# Patient Record
Sex: Male | Born: 1950 | Race: White | Hispanic: No | Smoking: Current every day smoker
Health system: Southern US, Community
[De-identification: ages and names within clinical notes are randomized; demographics above are authoritative.]

## PROBLEM LIST (undated history)

## (undated) DIAGNOSIS — M1990 Unspecified osteoarthritis, unspecified site: Secondary | ICD-10-CM

## (undated) DIAGNOSIS — Z9289 Personal history of other medical treatment: Secondary | ICD-10-CM

## (undated) DIAGNOSIS — I639 Cerebral infarction, unspecified: Secondary | ICD-10-CM

## (undated) DIAGNOSIS — K449 Diaphragmatic hernia without obstruction or gangrene: Secondary | ICD-10-CM

## (undated) DIAGNOSIS — G5600 Carpal tunnel syndrome, unspecified upper limb: Secondary | ICD-10-CM

## (undated) DIAGNOSIS — M359 Systemic involvement of connective tissue, unspecified: Secondary | ICD-10-CM

## (undated) DIAGNOSIS — B1920 Unspecified viral hepatitis C without hepatic coma: Secondary | ICD-10-CM

## (undated) DIAGNOSIS — Z720 Tobacco use: Secondary | ICD-10-CM

## (undated) DIAGNOSIS — R772 Abnormality of alphafetoprotein: Secondary | ICD-10-CM

## (undated) DIAGNOSIS — Z860100 Personal history of colon polyps, unspecified: Secondary | ICD-10-CM

## (undated) DIAGNOSIS — I10 Essential (primary) hypertension: Secondary | ICD-10-CM

## (undated) DIAGNOSIS — D494 Neoplasm of unspecified behavior of bladder: Secondary | ICD-10-CM

## (undated) DIAGNOSIS — K219 Gastro-esophageal reflux disease without esophagitis: Secondary | ICD-10-CM

## (undated) DIAGNOSIS — R319 Hematuria, unspecified: Secondary | ICD-10-CM

## (undated) DIAGNOSIS — M898X9 Other specified disorders of bone, unspecified site: Secondary | ICD-10-CM

## (undated) DIAGNOSIS — C61 Malignant neoplasm of prostate: Secondary | ICD-10-CM

## (undated) DIAGNOSIS — M899 Disorder of bone, unspecified: Secondary | ICD-10-CM

## (undated) DIAGNOSIS — K259 Gastric ulcer, unspecified as acute or chronic, without hemorrhage or perforation: Secondary | ICD-10-CM

## (undated) HISTORY — DX: Unspecified viral hepatitis C without hepatic coma: B19.20

## (undated) HISTORY — PX: OTHER SURGICAL HISTORY: SHX169

## (undated) HISTORY — DX: Tobacco use: Z72.0

## (undated) HISTORY — DX: Gastro-esophageal reflux disease without esophagitis: K21.9

## (undated) HISTORY — DX: Unspecified osteoarthritis, unspecified site: M19.90

## (undated) HISTORY — DX: Carpal tunnel syndrome, unspecified upper limb: G56.00

## (undated) HISTORY — DX: Personal history of colon polyps, unspecified: Z86.0100

## (undated) HISTORY — DX: Essential (primary) hypertension: I10

## (undated) HISTORY — DX: Disorder of bone, unspecified: M89.9

## (undated) HISTORY — DX: Gastric ulcer, unspecified as acute or chronic, without hemorrhage or perforation: K25.9

## (undated) HISTORY — DX: Cerebral infarction, unspecified: I63.9

## (undated) HISTORY — DX: Other specified disorders of bone, unspecified site: M89.8X9

## (undated) HISTORY — DX: Abnormality of alphafetoprotein: R77.2

## (undated) HISTORY — DX: Personal history of colonic polyps: Z86.010

## (undated) HISTORY — PX: HERNIA REPAIR: SHX51

## (undated) HISTORY — DX: Malignant neoplasm of prostate: C61

## (undated) HISTORY — DX: Diaphragmatic hernia without obstruction or gangrene: K44.9

---

## 1973-05-14 HISTORY — PX: FRACTURE SURGERY: SHX138

## 2001-05-14 DIAGNOSIS — I639 Cerebral infarction, unspecified: Secondary | ICD-10-CM

## 2001-05-14 HISTORY — DX: Cerebral infarction, unspecified: I63.9

## 2004-05-14 HISTORY — PX: COLONOSCOPY: SHX174

## 2006-05-14 HISTORY — PX: TRANSURETHRAL RESECTION OF PROSTATE: SHX73

## 2009-03-29 ENCOUNTER — Encounter (INDEPENDENT_AMBULATORY_CARE_PROVIDER_SITE_OTHER): Payer: Self-pay | Admitting: *Deleted

## 2009-05-14 HISTORY — PX: ESOPHAGOGASTRODUODENOSCOPY: SHX1529

## 2009-06-02 ENCOUNTER — Ambulatory Visit: Payer: Self-pay | Admitting: Internal Medicine

## 2009-06-03 DIAGNOSIS — R1013 Epigastric pain: Secondary | ICD-10-CM

## 2009-06-03 DIAGNOSIS — R634 Abnormal weight loss: Secondary | ICD-10-CM | POA: Insufficient documentation

## 2009-06-06 ENCOUNTER — Encounter: Payer: Self-pay | Admitting: Internal Medicine

## 2009-06-08 ENCOUNTER — Ambulatory Visit: Payer: Self-pay | Admitting: Internal Medicine

## 2009-06-08 ENCOUNTER — Encounter (INDEPENDENT_AMBULATORY_CARE_PROVIDER_SITE_OTHER): Payer: Self-pay | Admitting: *Deleted

## 2009-06-08 ENCOUNTER — Ambulatory Visit (HOSPITAL_COMMUNITY): Admission: RE | Admit: 2009-06-08 | Discharge: 2009-06-08 | Payer: Self-pay | Admitting: Internal Medicine

## 2009-06-10 ENCOUNTER — Encounter: Payer: Self-pay | Admitting: Internal Medicine

## 2009-06-13 ENCOUNTER — Ambulatory Visit (HOSPITAL_COMMUNITY): Admission: RE | Admit: 2009-06-13 | Discharge: 2009-06-13 | Payer: Self-pay | Admitting: Internal Medicine

## 2009-06-13 ENCOUNTER — Encounter: Payer: Self-pay | Admitting: Internal Medicine

## 2009-06-15 ENCOUNTER — Encounter: Payer: Self-pay | Admitting: Internal Medicine

## 2009-06-16 ENCOUNTER — Encounter (HOSPITAL_COMMUNITY): Admission: RE | Admit: 2009-06-16 | Discharge: 2009-07-16 | Payer: Self-pay | Admitting: Internal Medicine

## 2009-06-17 ENCOUNTER — Encounter: Payer: Self-pay | Admitting: Internal Medicine

## 2009-06-17 DIAGNOSIS — R9389 Abnormal findings on diagnostic imaging of other specified body structures: Secondary | ICD-10-CM | POA: Insufficient documentation

## 2009-06-17 LAB — CONVERTED CEMR LAB
Albumin ELP: 55.1 % — ABNORMAL LOW (ref 55.8–66.1)
Alpha-1-Globulin: 3.2 % (ref 2.9–4.9)
Gamma Globulin: 20.2 % — ABNORMAL HIGH (ref 11.1–18.8)

## 2009-06-20 ENCOUNTER — Encounter: Payer: Self-pay | Admitting: Internal Medicine

## 2009-06-24 ENCOUNTER — Telehealth (INDEPENDENT_AMBULATORY_CARE_PROVIDER_SITE_OTHER): Payer: Self-pay

## 2009-07-12 ENCOUNTER — Ambulatory Visit: Payer: Self-pay | Admitting: Internal Medicine

## 2009-07-12 DIAGNOSIS — Z860100 Personal history of colon polyps, unspecified: Secondary | ICD-10-CM | POA: Insufficient documentation

## 2009-07-12 DIAGNOSIS — Z8546 Personal history of malignant neoplasm of prostate: Secondary | ICD-10-CM

## 2009-07-14 ENCOUNTER — Encounter: Payer: Self-pay | Admitting: Internal Medicine

## 2009-07-15 ENCOUNTER — Encounter (HOSPITAL_COMMUNITY): Admission: RE | Admit: 2009-07-15 | Discharge: 2009-08-14 | Payer: Self-pay | Admitting: Internal Medicine

## 2009-07-20 ENCOUNTER — Encounter: Payer: Self-pay | Admitting: Internal Medicine

## 2009-07-26 ENCOUNTER — Ambulatory Visit (HOSPITAL_COMMUNITY): Payer: Self-pay | Admitting: Oncology

## 2009-08-01 ENCOUNTER — Encounter: Payer: Self-pay | Admitting: Internal Medicine

## 2009-08-15 ENCOUNTER — Encounter: Payer: Self-pay | Admitting: Internal Medicine

## 2009-08-16 ENCOUNTER — Encounter: Payer: Self-pay | Admitting: Internal Medicine

## 2009-09-08 ENCOUNTER — Ambulatory Visit: Payer: Self-pay | Admitting: Internal Medicine

## 2009-09-09 LAB — CONVERTED CEMR LAB
ALT: 24 units/L (ref 0–53)
AST: 20 units/L (ref 0–37)
Bilirubin, Direct: 0.2 mg/dL (ref 0.0–0.3)
Indirect Bilirubin: 0.5 mg/dL (ref 0.0–0.9)

## 2009-09-11 DIAGNOSIS — R1013 Epigastric pain: Secondary | ICD-10-CM

## 2009-09-11 DIAGNOSIS — K3189 Other diseases of stomach and duodenum: Secondary | ICD-10-CM

## 2009-09-22 ENCOUNTER — Encounter: Payer: Self-pay | Admitting: Internal Medicine

## 2009-09-28 ENCOUNTER — Encounter: Payer: Self-pay | Admitting: Internal Medicine

## 2010-01-20 ENCOUNTER — Encounter (INDEPENDENT_AMBULATORY_CARE_PROVIDER_SITE_OTHER): Payer: Self-pay

## 2010-06-13 NOTE — Miscellaneous (Signed)
Summary: Orders Update  Clinical Lists Changes  Problems: Added new problem of OTH NONSPC ABN FINDNG RAD&OTH EXM BODY STRUCTURE (ICD-793.99) Orders: Added new Test order of T-Serum Protein Electrophoresis [phone]) - Signed

## 2010-06-13 NOTE — Letter (Signed)
Summary: TC & PATH REPORT 06, [facility]  TC & PATH REPORT 06, [facility]   Imported By: Ricard Dillon 06/20/2009 15:19:21  _____________________________________________________________________  External Attachment:    Type:   Image     Comment:   External Document

## 2010-06-13 NOTE — Letter (Signed)
Summary: [facility]   Imported By: Diana Eves 08/15/2009 16:27:58  _____________________________________________________________________  External Attachment:    Type:   Image     Comment:   External Document

## 2010-06-13 NOTE — Letter (Signed)
Summary: HIDA SCAN ORDER  HIDA SCAN ORDER   Imported By: Ave Filter 07/12/2009 14:13:43  _____________________________________________________________________  External Attachment:    Type:   Image     Comment:   External Document

## 2010-06-13 NOTE — Letter (Signed)
Summary: CT ABD/PELVIS/DR TAPPER  CT ABD/PELVIS/DR TAPPER   Imported By: Diana Eves 08/01/2009 15:30:41  _____________________________________________________________________  External Attachment:    Type:   Image     Comment:   External Document

## 2010-06-13 NOTE — Letter (Signed)
Summary: External Other  External Other   Imported By: Peggyann Shoals 09/22/2009 09:41:45  _____________________________________________________________________  External Attachment:    Type:   Image     Comment:   External Document

## 2010-06-13 NOTE — Letter (Signed)
Summary: CT SCAN ORDER  CT SCAN ORDER   Imported By: Ricard Dillon 06/15/2009 12:37:20  _____________________________________________________________________  External Attachment:    Type:   Image     Comment:   External Document

## 2010-06-13 NOTE — Progress Notes (Signed)
Summary: Pt wants to hear from last lab work/still having problems  Phone Note Call from Patient   Caller: Patient Summary of Call: Pt would like to hear from last lab. York Spaniel he is still having problems, abd pain, losing weight, and he doesn't know what is going on. Initial call taken by: Cloria Spring LPN,  June 24, 2009 10:55 AM     Appended Document: Pt wants to hear from last lab work/still having problems extensive w/u thus far; some bone abnormalities but nothing to explain GI sx; SPEP not consisitent w MM - whichis good news - needs f/u here soon w extender (next week to 10 days) to evaluate GI Sx further.  Appended Document: Pt wants to hear from last lab work/still having problems tried to call pt- LMOM  Appended Document: Pt wants to hear from last lab work/still having problems pt aware

## 2010-06-13 NOTE — Letter (Signed)
Summary: EGD ORDER  EGD ORDER   Imported By: Diana Eves 06/06/2009 14:15:12  _____________________________________________________________________  External Attachment:    Type:   Image     Comment:   External Document

## 2010-06-13 NOTE — Assessment & Plan Note (Signed)
Summary: WT LOSS,STOMACH PROBLEM,EATTING MAKES HIM SICK/SS   Visit Type:  Initial Visit Primary Care Provider:  TAPPER  Chief Complaint:  WT LOSS & ABD. PAIN .  History of Present Illness: Mathew Grant 60 year old gentleman referred out of the courtesy of Dr. Wyvonnia Lora an [place], [place].  [name] has had at least a two-year history of intermittent boring epigastric pain. It occurs almost always postprandial hye, particularly worse with greasy heavy foods. He doesbetter with  fruits and vegetables he has less pain.  He's had occasional nausea and vomiting; he describes previously having frequent typical reflux symptoms  described as heartburn but over the years the symptoms have subsided. He does not have any odynophagia or dysphagia. He tells me he's lost about 15 pounds over the past year. He has any melena or rectal bleeding. He describes having a colonoscopy in [facility] .  Polyps were removed in 2006.Marland Kitchen There is a distant history of heavy alcohol abuse and parenteral recreational drug use. Mathew Grant tells me has a history of hepatitis C genotype 1A. He was previously offered antiviral therapy in [place], however, he declined treatment. He reportedly underwent both an abdominal CT and ultrasound in [facility] back in 2009 which did not elucidate a cause for this gentleman is symptoms. His gallbladder remains in situ. He is a long-term smoker. He's never had his upper GI tract evaluated. He ihas been on a variety of acid suppressant medications but recently none of which have really helped his abdominal pain very much. Laboratory evaluation through Dr. Jackolyn Confer office in October 2003 and CBC appear completely normal. Did not include a platelet count. Also has a hepatic profile was completely normal his albumin was 4.4 TSH 2.0  Preventive Screening-Counseling & Management  Alcohol-Tobacco     Smoking Status: current  Medications Prior to Update: 1)  None  Current  Medications (verified): 1)  Percocet .... 7.5/325 1 Tablet Every 8 Hrs 2)  Atenolol .... 50mg  Once Daily 3)  Omeprazole .... 20mg  Two Times A Day  Allergies (verified): No Known Drug Allergies  Past History:  Past Medical History: HX OF CVA Arthritis Hepatitis C Hypertension GERD CARPAL TUNNEL  HX OF POLYPS  prostate ca   Past Surgical History: TCS IN 2006 @ [facility] IN 2008 Amputation of 4 fingers L hand   Family History: Father: ALIVE- HEART PROBLEMS  Mother: ALIVE- ARTHRITIS & DM Siblings:  2 BROTHERS  0 SISTERS  No FH of Colon Cancer: Family History of Diabetes:   Social History: Marital Status: SINGLE  Children: 3 Occupation: DISABILITY  Patient currently smokes.  Alcohol Use - no Smoking Status:  current  Vital Signs:  Patient profile:   60 year old male Height:      68 inches Weight:      156 pounds BMI:     23.81 Temp:     98.5 degrees F oral Pulse rate:   60 / minute BP sitting:   128 / 84  (left arm)  Vitals Entered By: Peggyann Shoals (June 02, 2009 11:10 AM)  Physical Exam  General:  very Mathew Grant alert polite gentleman resting comfortably Eyes:  no scleral icterus. Conjunctiva are pink Abdomen:  nondistended positive bowel sounds soft and nontender the abdomen is soft he does have localized epigastric tenderness just below the xiphoid process. I do not appreciate any mass nor do I appreciate any hepatosplenomegaly extremities and no edema  Hemoccults were pending per Dr. Jackolyn Confer office.  Impression & Recommendations:  Impression: 60 year old gentleman with at least a 2 year history of insidiously worsening epigastric pain which almost always occurs post prandially. He has a distant history of chronic long-standing gastroesophageal reflux disease symptoms but his symptoms have settled down over the past couple years. He has  not appreciated any significant improvement with acid suppression therapy. By report, prior CT and ultrasound  failed to demonstrate any significant pathology. He has a history of long-standing alcohol and tobacco exposure. He's never had his upper GI tract evaluated. He has a history of hepatitis C genotype 1A not treated.  I agree with Dr. Margo Common, we need to rule out luminal pathology in his upper GI tract. Clinically, I doubt he has advanced chronic liver disease at this time. His symptoms sound more like gallbladder origin rather than anything else. We need to evaluate his upper GI tract to screen him for a Barrett's, evaluate him for occultl peptic ulcer disease and malignancy. He will also be worthwhile to screen him for stigmata of chronic liver disease  Recommendations: Diagnostic EGD in the near future. Risks, benefits, limitations, alternatives, have been reviewed. His questions have been answered. He is agreeable. If EGD is unrevealing as far as etiology of his pain would then revisit the gallbladder with a HIDA scan.   Will retrieve results of his colonoscopy CT and ultrasound previously done. Would also like to check on the Hemoccults done through Dr. Jackolyn Confer office previously.  Further recommendations to follow. I want to thank Dr. Wyvonnia Lora for his kind referral.  Appended Document: Orders Update-charge    Clinical Lists Changes  Problems: Added new problem of EPIGASTRIC PAIN (ICD-789.06) Added new problem of WEIGHT LOSS, ABNORMAL (ICD-783.21) Orders: Added new Service order of Consultation Level IV [phone]) - Signed

## 2010-06-13 NOTE — Letter (Signed)
Summary: HEMOCCULT RESULTS/DR TAPPER  HEMOCCULT RESULTS/DR TAPPER   Imported By: Diana Eves 06/13/2009 16:50:32  _____________________________________________________________________  External Attachment:    Type:   Image     Comment:   External Document

## 2010-06-13 NOTE — Letter (Signed)
Summary: [facility]  [facility]   Imported By: Diana Eves 07/14/2009 09:58:49  _____________________________________________________________________  External Attachment:    Type:   Image     Comment:   External Document

## 2010-06-13 NOTE — Miscellaneous (Signed)
Summary: Orders Update  Clinical Lists Changes  Problems: Added new problem of SCREENING FOR UNSPECIFIED CONDITION (ICD-V82.9) Orders: Added new Test order of T-Creatinine Blood ([id number]) - Signed 

## 2010-06-13 NOTE — Letter (Signed)
Summary: BONE SCAN ORDER  BINE SCAN ORDER   Imported By: Ricard Dillon 06/15/2009 12:10:37  _____________________________________________________________________  External Attachment:    Type:   Image     Comment:   External Document

## 2010-06-13 NOTE — Assessment & Plan Note (Signed)
Summary: fu, gu   Visit Type:  Follow-up Visit Primary Care Provider:  Tapper  Chief Complaint:  F/U  Gerd/abd pain.  History of Present Illness:  60 year old gentleman with a history of dyspeptic symptoms and weight loss recently. EGD negative for any significant pathology. Gallbladder ultrasound HIDA also negative. He responded very nicely to a 3 week course of Nexium 40 mg orally daily. He states his symptoms were most totally abolished. However, he ran out of samples and did not get his prescription filled. 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 It was recommended in 2006 he return in 3-5 years for followup. He has a history of hepatitis C. genotype 1A with documented viremia. He has declined treatment previously history of a lytic pelvic lesions for which he has seen Dr. Mariel Sleet. Dr. Mariel Sleet saw him last month. Workup in progress. History of low-grade prostate cancer.  Of note, his weight loss is stabilized he currently weighs 154 pounds as he did his last visit   Current Medications (verified): 1)  Percocet .... 7.5/325 1 Tablet Every 8 Hrs As Needed 2)  Atenolol .... 50mg  Once Daily  Allergies (verified): No Known Drug Allergies  Past History:  Past Medical History: Last updated: 06/02/2009 HX OF CVA Arthritis Hepatitis C Hypertension GERD CARPAL TUNNEL  HX OF POLYPS  prostate ca   Past Surgical History: Last updated: 06/02/2009 TCS IN 2006 @ [facility] IN 2008 Amputation of 4 fingers L hand   Family History: Last updated: 06/02/2009 Father: ALIVE- HEART PROBLEMS  Mother: ALIVE- ARTHRITIS & DM Siblings:  2 BROTHERS  0 SISTERS  No FH of Colon Cancer: Family History of Diabetes:   Social History: Last updated: 06/02/2009 Marital Status: SINGLE  Children: 3 Occupation: DISABILITY  Patient currently smokes.  Alcohol Use - no  Vital Signs:  Patient profile:   60  year old male Height:      68 inches Weight:      154.50 pounds BMI:     23.58 Temp:     98.2 degrees F oral Pulse rate:   64 / minute BP sitting:   122 / 80  (left arm) Cuff size:   regular  Vitals Entered By: Cloria Spring LPN (September 08, 2009 3:06 PM)  Physical Exam  General:  alert conversant no acute distress Eyes:  no scleral icterus Abdomen:  flat positive bowel sounds entirely soft and nontender without mass or organomegaly  Impression & Recommendations: Impression: 60 year old gentleman with nonspecific abdominal discomfort significantly relieved with acid suppression therapy. Negative workup thus far is reassuring. 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 He did have relatively high grade adenomas on his 2006 colonoscopy at [facility].  Lytic lesions on CT workup in progress per Dr. Mariel Sleet.  Recommendations: Will simply keep him on long-term acid suppression therapy in the way of Nexium 40 mg orally daily. I have written him a prescription today and provided him with samples once again. I recommended he take 40 mg each day I have also recommended proceeding with a surveillance colonoscopy this time. He want s to wait until summer as his kids are in school and he is a single parent. Plan to call him back in July for surveillance colonoscopy. We'll also check hepatic function profile and an alpha-fetoprotein today. Further recommendations to follow.  Other Orders: T-Hepatic Function [phone]) T-AFP Tumor Markers [phone])  Appended Document: Orders  Update    Clinical Lists Changes  Problems: Added new problem of DYSPEPSIA (ICD-536.8) Orders: Added new Service order of Est. Patient Level IV ([id number]) - Signed

## 2010-06-13 NOTE — Letter (Signed)
Summary: External Other  External Other   Imported By: Peggyann Shoals 09/28/2009 11:32:17  _____________________________________________________________________  External Attachment:    Type:   Image     Comment:   External Document

## 2010-06-13 NOTE — Letter (Signed)
Summary: Patient Notice, Endo Biopsy Results  [facility]  [address]   [place], [place] [address]   Phone: [phone]  Fax: [phone]       June 10, 2009   Mathew Grant [address] [place], [place]  [address] 1950-11-09    Dear [name],  I am pleased to inform you that the biopsies taken during your recent endoscopic examination did not show any evidence of cancer upon pathologic examination.  They did show only mild inflammation.  Additional information/recommendations:  Continue with the treatment plan as outlined on the day of your exam.  Please call us if you are having persistent problems or have questions about your condition that have not been fully answered at this time.  Sincerely,    R. Roetta Sessions MD  [facility] Ph: [phone]   Fax: [phone]   Appended Document: Patient Notice, Endo Biopsy Results Letter mailed to pt.

## 2010-06-13 NOTE — Assessment & Plan Note (Signed)
Summary: GI SX,GLU   Visit Type:  Follow-up Visit Primary Care Provider:  Tapper  Chief Complaint:  F/U test results.  History of Present Illness: Mathew Grant is here for f/u visit. He has 2 yr h/o boring epigastric pain. Also c/o pain in lower back/hips worse in am. W/U so far includes EGD, CT, Bone scan, SPEP. See below for details. He c/o ongoing abd pain, worse with spicy, greasy foods. Sometimes if he lays on stomach, pain is relieved. C/O nausea. He is afraid to eat because of the pain. Heartburn controlled. C/O gradual wt loss over the last one year of 30 pounds. He denies melena, brbpr, constipation, diarrhea.   Prior Abd U/S 8/09 at [facility], gallbladder normal. CBD 7mm. Liver and spleen appeared normal. CT A/P without CM 7/09 unremarkable.   EGD, 06/08/09 -->  Tiny distal esophageal erosions consistent with mild erosive reflux esophagitis, otherwise normal esophagus.  Patchy tiny erosions and submucosal petechiae of the gastric mucosa without ulcer, infiltrating process or other abnormality seen, status post biopsy.  Slightly eroded, friable duodenal mucosa of uncertain significance, status post biopsy. Bx showed peptic duodenitis, chronic gastritis. No H. Pylori.  CT A/P, 06/13/09 -->  No acute inflammatory process within abdomen.  A few lytic lesions are noted left iliac bone.  Correlation with bone scan is recommended to exclude metastatic disease.  Probable TURP defect anterior aspect of prostate.  Bilateral pars defect at L5 level.  There is about 3 mm anterolisthesis L5 on S1 vertebral body. Mild emphysematous changes lung bases.  Bone Scan 06/16/09 --> No evidence of osseous metastatic disease. The observed lesions on the CT are purely lytic, atypical for prostate cancer. Despite a negative radionuclide bone scan, the lesions still could represent purely lytic bony metastases or more commonly multiple myeloma.  Workup for multiple myeloma recommended.    Current Medications  (verified): 1)  Percocet .... 7.5/325 1 Tablet Every 8 Hrs 2)  Atenolol .... 50mg  Once Daily 3)  Omeprazole .... Take 1 Tablet By Mouth Once A Day  Allergies (verified): No Known Drug Allergies  Review of Systems      See HPI GU:  Denies urinary burning, blood in urine, urinary frequency, and urinary hesitancy.  Vital Signs:  Patient profile:   59 year old male Height:      68 inches Weight:      154 pounds BMI:     23.50 Temp:     98.3 degrees F oral Pulse rate:   60 / minute BP sitting:   122 / 80  (left arm) Cuff size:   regular  Vitals Entered By: Cloria Spring LPN (July 13, 5282 1:17 PM)  Physical Exam  General:  Well developed, well nourished, no acute distress. Head:  Normocephalic and atraumatic. Eyes:  Conjunctivae pink, no scleral icterus.  Mouth:  Oropharyngeal mucosa moist, pink.  No lesions, erythema or exudate.    Abdomen:  Soft. Mild epig tenderness. No rebound or guarding. No HSM or masses. No abd bruit or hernia. Extremities:  No clubbing, cyanosis, edema or deformities noted. Neurologic:  Alert and  oriented x4;  grossly normal neurologically. Skin:  Intact without significant lesions or rashes. Psych:  Alert and cooperative. Normal mood and affect.  Impression & Recommendations:  Problem # 1:  EPIGASTRIC PAIN (ICD-789.06)  Ongoing postprandial epigastric pain, gradual weight loss. EGD failed to show cause of pain. Symptoms still sound biliary in nature. Will proceed with HIDA with fatty meal challenge. If patient ends  up with surgery, he has been informed to request liver bx given h/o HCV.  Orders: Est. Patient Level III (95621)  Problem # 2:  COLONIC POLYPS, HX OF (ICD-V12.72)  Will try to retrieve last TCS report and path. Apparently done at [facility]. Previously checked with [facility].  Orders: Est. Patient Level III (30865)  Problem # 3:  ADENOCARCINOMA, PROSTATE, HX OF (ICD-V10.46)  Prostate cancer found at time of TURP for suspected  BPH in 8/08 by Dr. Jerre Simon. Per path, Gleason's score 5. Patient has several lytic appearing lesions on the left iliac bone. Bone scan negative but radiologist concerned they could be due to purely lytic bony metastases or multiple myeloma.  SPEP showed nonspecific diffuse polyclonal type increase in gamma globulins.  ?needs referral back to Dr. Jerre Simon or oncologist. To discuss with Dr. Jena Gauss.  Orders: Est. Patient Level III ([id number])     Appended Document: GI SX,GLU Discussed with patient and with Dr. Jena Gauss. Recommend appt with Dr. Mariel Sleet for h/o prostate cancer, lytic lesions of left iliac, wt loss, back pain. Please arrange.   Appended Document: GI SX,GLU Referral faxed to Dr Mariel Sleet.  Appended Document: GI SX,GLU Received records from [facility]. TCS 12/06. 3mm ascending colon tubular adenoma. 2mm tubular villous adenoma in rectum 10cm from anus. F/U TCS recommended for 3-5 years. Will discuss at next OV to see if patient wants to have TCS done now.  Appended Document: GI SX,GLU Pt is scheduled for OV with Dr. Jena Gauss on 09/08/2009.

## 2010-06-13 NOTE — Letter (Signed)
Summary: [name] REFERRAL  [name] REFERRAL   Imported By: Ave Filter 07/20/2009 15:45:10  _____________________________________________________________________  External Attachment:    Type:   Image     Comment:   External Document

## 2010-06-13 NOTE — Letter (Signed)
Summary: OFFICE NOTES/[facility]  OFFICE NOTES/[facility]   Imported By: Diana Eves 08/16/2009 14:38:38  _____________________________________________________________________  External Attachment:    Type:   Image     Comment:   External Document

## 2010-06-13 NOTE — Letter (Signed)
Summary: Recall, Screening Colonoscopy Only  [facility]  [address]   [place], [place] [address]   Phone: [phone]  Fax: [phone]    January 20, 2010  Mathew Grant [address] [place], [place]  [address] 1950-05-17   Dear [name],   Our records indicate it is time to schedule your colonoscopy.    Please call our office at [phone] and ask for the nurse.   Thank you,  Hendricks Limes, LPN Cloria Spring, LPN  [facility] Ph: [phone]   Fax: [phone]

## 2010-07-31 LAB — CREATININE, SERUM
GFR calc Af Amer: >60 mL/min (ref >60)
GFR calc non Af Amer: >60 mL/min (ref >60)

## 2010-08-07 LAB — DIFFERENTIAL
Basophils Absolute: 0.1 10*3/uL (ref 0.0–0.1)
Basophils Relative: 1 % (ref 0–1)
Eosinophils Relative: 1 % (ref 0–5)
Lymphocytes Relative: 25 % (ref 12–46)
Monocytes Absolute: 0.7 10*3/uL (ref 0.1–1.0)

## 2010-08-07 LAB — COMPREHENSIVE METABOLIC PANEL
ALT: 42 U/L (ref 0–53)
AST: 35 U/L (ref 0–37)
Alkaline Phosphatase: 46 U/L (ref 39–117)
BUN: 11 mg/dL (ref 6–23)
CO2: 30 mEq/L (ref 19–32)
Chloride: 99 mEq/L (ref 96–112)
Creatinine, Ser: 0.82 mg/dL (ref 0.4–1.5)
GFR calc Af Amer: >60 mL/min (ref >60)
GFR calc non Af Amer: >60 mL/min (ref >60)
Glucose, Bld: 90 mg/dL (ref 70–99)
Potassium: 4.3 mEq/L (ref 3.5–5.1)
Total Bilirubin: 0.9 mg/dL (ref 0.3–1.2)

## 2010-08-07 LAB — CBC
MCHC: 34.8 g/dL (ref 30.0–36.0)
RBC: 4.58 MIL/uL (ref 4.22–5.81)
RDW: 13.5 % (ref 11.5–15.5)

## 2010-08-07 LAB — HCV RNA QUANT: HCV Quantitative Log: 6.7 {Log} — ABNORMAL HIGH (ref <1.63)

## 2010-08-07 LAB — PSA: PSA: 0.4 ng/mL (ref 0.10–4.00)

## 2010-08-07 LAB — CANCER ANTIGEN 19-9: CA 19-9: 16.9 U/mL — ABNORMAL LOW (ref <35.0)

## 2010-10-03 ENCOUNTER — Other Ambulatory Visit: Payer: Self-pay | Admitting: Internal Medicine

## 2010-10-03 NOTE — Telephone Encounter (Signed)
Pt needs OV to setup colonoscopy please.

## 2010-10-11 ENCOUNTER — Encounter: Payer: Self-pay | Admitting: Gastroenterology

## 2010-10-11 ENCOUNTER — Ambulatory Visit (INDEPENDENT_AMBULATORY_CARE_PROVIDER_SITE_OTHER): Payer: Medicaid Other | Admitting: Gastroenterology

## 2010-10-11 VITALS — BP 160/95 | HR 58 | Temp 98.4°F | Ht 68.0 in | Wt 149.8 lb

## 2010-10-11 DIAGNOSIS — Z860100 Personal history of colon polyps, unspecified: Secondary | ICD-10-CM

## 2010-10-11 DIAGNOSIS — R634 Abnormal weight loss: Secondary | ICD-10-CM

## 2010-10-11 DIAGNOSIS — K3189 Other diseases of stomach and duodenum: Secondary | ICD-10-CM

## 2010-10-11 DIAGNOSIS — B1920 Unspecified viral hepatitis C without hepatic coma: Secondary | ICD-10-CM

## 2010-10-11 DIAGNOSIS — R1013 Epigastric pain: Secondary | ICD-10-CM

## 2010-10-11 NOTE — Telephone Encounter (Signed)
Patient had OV today 10/11/10 @ 1100 with LSL

## 2010-10-11 NOTE — Assessment & Plan Note (Signed)
Weight is stable her last one year. Reports a 35 pound weight loss over the past 3 years. He'll continue to monitor for any further weight loss on this if he develops this. We'll also retrieve lab work from Dr. Margo Common for review.

## 2010-10-11 NOTE — Assessment & Plan Note (Signed)
Due for surveillance colonoscopy at this time.  I have discussed the risks, alternatives, benefits with regards to but not limited to the risk of reaction to medication, bleeding, infection, perforation and the patient is agreeable to proceed. Written consent to be obtained.  

## 2010-10-11 NOTE — Assessment & Plan Note (Signed)
Well controlled on Nexium 

## 2010-10-11 NOTE — Progress Notes (Signed)
Primary Care Physician:  Louie Boston, MD  Primary Gastroenterologist:  Roetta Sessions, MD  Chief Complaint  Patient presents with  . Colon Cancer Screening    HPI:  Mathew Grant is a 60 y.o. male here to schedule surveillance colonoscopy. He has a history of high grade colonic adenomas, last colonoscopy in 2006. Overall he is doing well. Recently he ran out of Nexium and he had recurrent abdominal pain, sore throat, and cough. Better now back on Nexium. Denies vomiting, dysphagia. BM regular. No melena. Given Celebrex for a few days recently (for arthritis, trying to get off of Percocet which has been over 10 years) and developed one episode of small-volume hematochezia. Resolved after stopping the medication. No longer seeing anyone for prostate cancer. Patient refused chemo or radiation. History of lytic lesions of the iliac crest, negative bone scan and workup for multiple myeloma negative. It was felt that the lytic lesions were atypical for prostate cancer metastasis cannot be excluded. Dr. Margo Common is following his PSA. He states he has not seen a urologist in a couple of years.  Patient states had blood work with Dr. Margo Common a few months ago. He states Dr. Margo Common is following him for his chronic hepatitis C. He has had concerns about his weight loss. Weight down about 35 pounds over the last few years but states it's been stable for the past several months.  Current Outpatient Prescriptions  Medication Sig Dispense Refill  . atenolol (TENORMIN) 50 MG tablet Take 50 mg by mouth daily.        Marland Kitchen NEXIUM 40 MG capsule TAKE 1 CAPSULE EVERY DAY  31 each  11  . oxyCODONE-acetaminophen (PERCOCET) 7.5-325 MG per tablet Take 1 tablet by mouth every 4 (four) hours as needed.          Allergies as of 10/11/2010  . (No Known Allergies)    Past Medical History  Diagnosis Date  . CVA (cerebral infarction)   . Arthritis   . HCV (hepatitis C virus)   . HTN (hypertension)   . GERD  (gastroesophageal reflux disease)   . Personal history of colonic polyps   . Prostate cancer   . Carpal tunnel syndrome   . Lytic lesion of bone on x-ray     Past Surgical History  Procedure Date  . Colonoscopy 2006    [facility], high grade adenomas  . Transurethral resection of prostate 2008  . Amputation of four fingers left hand   . Esophagogastroduodenoscopy 05/2009    mild erosive reflux esophagitis, peptic duodenitis, chronic gastritis. No H. Pylori.    Family History  Problem Relation Age of Onset  . Heart disease Father   . Diabetes Mother   . Colon cancer Neg Hx     History   Social History  . Marital Status: Divorced    Spouse Name: N/A    Number of Children: 3  . Years of Education: N/A   Occupational History  . disabled    Social History Main Topics  . Smoking status: Current Everyday Smoker -- 1.0 packs/day    Types: Cigarettes  . Smokeless tobacco: Not on file  . Alcohol Use: No     none in over six years  . Drug Use: No  . Sexually Active: Not on file   Other Topics Concern  . Not on file   Social History Narrative  . No narrative on file      ROS:  General: Negative for anorexia, fever, chills, fatigue,  weakness. See HPI. Eyes: Negative for vision changes.  ENT: Negative for hoarseness, difficulty swallowing , nasal congestion. CV: Negative for chest pain, angina, palpitations, dyspnea on exertion, peripheral edema.  Respiratory: Negative for dyspnea at rest, dyspnea on exertion, cough, sputum, wheezing.  GI: See history of present illness. GU:  Negative for dysuria, hematuria, urinary incontinence, urinary frequency, nocturnal urination.  MS: Chronic joint pain, low back pain.  Derm: Negative for rash or itching.  Neuro: Negative for weakness, abnormal sensation, seizure, frequent headaches, memory loss, confusion.  Psych: Negative for anxiety, depression, suicidal ideation, hallucinations.  Endo: See HPI. Heme: Negative for bruising or  bleeding. Allergy: Negative for rash or hives.    Physical Examination:  BP 160/95  Pulse 58  Temp(Src) 98.4 F (36.9 C) (Temporal)  Ht 5\' 8"  (1.727 m)  Wt 149 lb 12.8 oz (67.949 kg)  BMI 22.78 kg/m2   General: Well-nourished, well-developed in no acute distress.  Head: Normocephalic, atraumatic.   Eyes: Conjunctiva pink, no icterus. Mouth: Oropharyngeal mucosa moist and pink , no lesions erythema or exudate. Neck: Supple without thyromegaly, masses, or lymphadenopathy.  Lungs: Clear to auscultation bilaterally.  Heart: Regular rate and rhythm, no murmurs rubs or gallops.  Abdomen: Bowel sounds are normal, nontender, nondistended, no hepatosplenomegaly or masses, no abdominal bruits or    hernia , no rebound or guarding.   Extremities: No lower extremity edema.  Neuro: Alert and oriented x 4 , grossly normal neurologically.  Skin: Warm and dry, no rash or jaundice.   Psych: Alert and cooperative, normal mood and affect.

## 2010-10-11 NOTE — Assessment & Plan Note (Signed)
Retrieve lab work from Dr. Margo Common for further review. Patient never received treatment, he declined. No evidence of cirrhosis on CT a year ago. He should have his LFTs and alpha-fetoprotein tumor marker checked every 6 months. We'll arrange for this to be done if not done by Dr. Margo Common recently.

## 2010-10-11 NOTE — Progress Notes (Signed)
Cc to PCP 

## 2010-10-13 DIAGNOSIS — R772 Abnormality of alphafetoprotein: Secondary | ICD-10-CM

## 2010-10-13 HISTORY — PX: COLONOSCOPY: SHX174

## 2010-10-13 HISTORY — DX: Abnormality of alphafetoprotein: R77.2

## 2010-10-24 ENCOUNTER — Encounter: Payer: Self-pay | Admitting: Internal Medicine

## 2010-10-24 ENCOUNTER — Ambulatory Visit (HOSPITAL_COMMUNITY)
Admission: RE | Admit: 2010-10-24 | Discharge: 2010-10-24 | Disposition: A | Discharge status: Home / Self Care | Payer: Medicaid Other | Source: Ambulatory Visit | Attending: Internal Medicine | Admitting: Internal Medicine

## 2010-10-24 DIAGNOSIS — Z860100 Personal history of colon polyps, unspecified: Secondary | ICD-10-CM | POA: Insufficient documentation

## 2010-10-24 DIAGNOSIS — Z79899 Other long term (current) drug therapy: Secondary | ICD-10-CM | POA: Insufficient documentation

## 2010-10-24 DIAGNOSIS — Z8546 Personal history of malignant neoplasm of prostate: Secondary | ICD-10-CM | POA: Insufficient documentation

## 2010-10-24 DIAGNOSIS — Z1211 Encounter for screening for malignant neoplasm of colon: Secondary | ICD-10-CM

## 2010-10-24 DIAGNOSIS — Z09 Encounter for follow-up examination after completed treatment for conditions other than malignant neoplasm: Secondary | ICD-10-CM | POA: Insufficient documentation

## 2010-10-24 DIAGNOSIS — I10 Essential (primary) hypertension: Secondary | ICD-10-CM | POA: Insufficient documentation

## 2010-10-24 HISTORY — PX: OTHER SURGICAL HISTORY: SHX169

## 2010-11-01 LAB — AFP TUMOR MARKER: AFP-Tumor Marker: 11.9

## 2010-11-01 LAB — COMPREHENSIVE METABOLIC PANEL
ALT: 22 U/L (ref 10–40)
Potassium: 3.9 mmol/L
Total bilirubin, fluid: 0.3

## 2010-11-01 LAB — CBC WITH DIFFERENTIAL/PLATELET
Hemoglobin: 14 g/dL (ref 13.5–17.5)
WBC: 8.9

## 2010-11-14 ENCOUNTER — Telehealth: Payer: Self-pay | Admitting: Gastroenterology

## 2010-11-14 ENCOUNTER — Other Ambulatory Visit: Payer: Self-pay | Admitting: Gastroenterology

## 2010-11-14 ENCOUNTER — Other Ambulatory Visit: Payer: Self-pay | Admitting: Internal Medicine

## 2010-11-14 DIAGNOSIS — B1920 Unspecified viral hepatitis C without hepatic coma: Secondary | ICD-10-CM

## 2010-11-14 DIAGNOSIS — R799 Abnormal finding of blood chemistry, unspecified: Secondary | ICD-10-CM

## 2010-11-14 NOTE — Telephone Encounter (Signed)
Per LSL- ok to order lab. Pt needs to have OV in 2 weeks after lab and CT. Tried to call pt- LMOM

## 2010-11-14 NOTE — Telephone Encounter (Signed)
Received fax from Dr. Margo Common for advice regarding elevated AFP on this patient. Patient has given [place] the impression that Dr. Beckey Rutter has been following him for his chronic hepatitis C. Patient previously declined treatment. The last AFP that we have on the patient was from 08/2009 was normal at 2.2. Current AFP was 11.9. CBC was normal, LFTs normal. Last imaging study in 2011 showed normal liver. No evidence of cirrhosis.  To further evaluate elevated AFP, would recommend dedicated CT of the liver with contrast to rule out hepatoma. This can be ordered by [place] or through Dr. Jackolyn Confer office whichever Dr. Margo Common prefers. Please also find out if patient has had HCV RNA quantitative labs done within the last few years.  Please abstract labs into EPIC.  Please call Dr. Jackolyn Confer office and discuss the above.

## 2010-11-14 NOTE — Telephone Encounter (Signed)
Spoke with Arline Asp- Dr. Hillery Jacks nurse. Informed of LSL recommendations. She stated the last hep c bloodwork that was done on the pt was done in 2006. Do you want to order further labs?  They also want [place] to order the CT of the liver.

## 2010-11-14 NOTE — Telephone Encounter (Signed)
Faxed notes to [employer] to obtain pre certification.

## 2010-11-14 NOTE — Telephone Encounter (Signed)
OK to order HCV RNA quantitative.

## 2010-11-16 ENCOUNTER — Encounter: Payer: Self-pay | Admitting: Internal Medicine

## 2010-11-16 NOTE — Telephone Encounter (Signed)
I received approval notice from [employer] #[id number]. I tried to call pt about appointment,no answer,lmom.

## 2010-11-16 NOTE — Telephone Encounter (Signed)
Mailed letter with lab orders and letter about scheduling ov to pt

## 2010-11-16 NOTE — Telephone Encounter (Signed)
Mailed pt a letter about ct scan

## 2010-11-20 ENCOUNTER — Ambulatory Visit (HOSPITAL_COMMUNITY): Admission: RE | Admit: 2010-11-20 | Payer: Medicaid Other | Source: Ambulatory Visit

## 2010-11-22 ENCOUNTER — Ambulatory Visit (HOSPITAL_COMMUNITY)
Admission: RE | Admit: 2010-11-22 | Discharge: 2010-11-22 | Disposition: A | Discharge status: Home / Self Care | Payer: Medicaid Other | Source: Ambulatory Visit | Attending: Internal Medicine | Admitting: Internal Medicine

## 2010-11-22 ENCOUNTER — Other Ambulatory Visit: Payer: Self-pay | Admitting: Gastroenterology

## 2010-11-22 DIAGNOSIS — Z8546 Personal history of malignant neoplasm of prostate: Secondary | ICD-10-CM | POA: Insufficient documentation

## 2010-11-22 DIAGNOSIS — R109 Unspecified abdominal pain: Secondary | ICD-10-CM | POA: Insufficient documentation

## 2010-11-22 DIAGNOSIS — B1920 Unspecified viral hepatitis C without hepatic coma: Secondary | ICD-10-CM

## 2010-11-22 MED ORDER — IOHEXOL 300 MG/ML  SOLN
100.0000 mL | Freq: Once | INTRAMUSCULAR | Status: AC | PRN
  Administered 2010-11-22: 100 mL via INTRAVENOUS

## 2010-11-23 LAB — HCV RNA QUANT RFLX ULTRA OR GENOTYP
HCV Quantitative Log: 7.07 {Log} — ABNORMAL HIGH (ref <1.63)
HCV Quantitative: 11700000 IU/mL — ABNORMAL HIGH (ref <43)

## 2010-11-27 NOTE — Op Note (Signed)
  NAMEJIMI, SCHAPPERT                ACCOUNT NO.:  [id number]  MEDICAL RECORD NO.:  [id number]  LOCATION:  [facility]                          FACILITY:  [facility]  PHYSICIAN:  R. Roetta Sessions, MD FACP FACGDATE OF BIRTH:  August 21, 1950  DATE OF PROCEDURE:  10/24/2010 DATE OF DISCHARGE:                              OPERATIVE REPORT   PROCEDURE:  Ileocolonoscopy surveillance.  INDICATIONS FOR PROCEDURE:  A 60 year old gentleman and last had a colonoscopy over at [facility] in 2006, reporting with possible high-grade dysplasia, apparently he has not had a followup.  He is really not having any problems now as far as lower GI tract symptoms, although previously he took some Celebrex, had a slight amount of rectal bleeding but stopped this medication, he is doing well now.  He is here for surveillance for which he is overdue.  Risks, benefits, limitations, alternatives, imponderables have been discussed, questions answered. Please see the documentation in the medical record.  PROCEDURE NOTE:  O2 saturation, blood pressure, pulse, respirations monitored throughout the entirety of the procedure.  CONSCIOUS SEDATION:  Versed 5 mg IV, Demerol 100 mg IV in divided doses.  INSTRUMENT:  Pentax video chip system.  FINDINGS:  Digital rectal exam revealed no abnormalities.  Endoscopic findings:  Prep was adequate.  Colon:  Colonic mucosa was surveyed from the rectosigmoid junction through the left transverse right colon to the appendiceal orifice, ileocecal valve/cecum.  These structures were well seen and photographed for the record.  Terminal ileum was intubated to 10 cm.  From this level, scope was slowly and cautiously withdrawn.  All previously mentioned mucosal surfaces were again seen.  The colonic mucosa appeared normal as did the terminal mucosa.  The scope was pulled down to the rectum where a thorough examination of rectal mucosa including retroflexed view of the anal verge demonstrated  no abnormalities.  The patient tolerated the procedure well and was reactive to endoscopy.  Cecal withdrawal time 8 minutes.  IMPRESSION:  Normal rectum, colon, terminal ileum.  RECOMMENDATIONS:  Repeat colonoscopy 5 years.     Jonathon Bellows, MD Caleen Essex     RMR/MEDQ  D:  10/24/2010  T:  10/24/2010  Job:  [id number]  cc:   Wyvonnia Lora, MD Fax: [phone]  Electronically Signed by Lorrin Goodell M.D. on 11/27/2010 08:40:13 AM

## 2010-11-30 NOTE — Progress Notes (Signed)
Pt had OV on 12/04/2010.

## 2010-12-04 ENCOUNTER — Ambulatory Visit (INDEPENDENT_AMBULATORY_CARE_PROVIDER_SITE_OTHER): Payer: Medicaid Other | Admitting: Gastroenterology

## 2010-12-04 ENCOUNTER — Encounter: Payer: Self-pay | Admitting: Gastroenterology

## 2010-12-04 VITALS — BP 134/77 | HR 64 | Temp 97.8°F | Ht 69.0 in | Wt 147.4 lb

## 2010-12-04 DIAGNOSIS — Z86010 Personal history of colonic polyps: Secondary | ICD-10-CM

## 2010-12-04 DIAGNOSIS — B1920 Unspecified viral hepatitis C without hepatic coma: Secondary | ICD-10-CM

## 2010-12-04 DIAGNOSIS — R634 Abnormal weight loss: Secondary | ICD-10-CM

## 2010-12-04 DIAGNOSIS — R1013 Epigastric pain: Secondary | ICD-10-CM

## 2010-12-04 DIAGNOSIS — R799 Abnormal finding of blood chemistry, unspecified: Secondary | ICD-10-CM

## 2010-12-04 DIAGNOSIS — R772 Abnormality of alphafetoprotein: Secondary | ICD-10-CM

## 2010-12-04 NOTE — Assessment & Plan Note (Signed)
Postprandial epigastric pain, progressive. Improves with lying prone after meals. Recent CT did not reveal a cause of his pain, this was done for elevated alpha-fetoprotein tumor marker. Gallbladder remains in situ. 30% of stones will not show up on a CT. Check an abdominal ultrasound. He is a chronic smoker. He also has chronic progressive weight loss. Cannot rule out mesenteric ischemia, at least no indication reported on current CT modality. Question SMA syndrome given symptoms improve when he lies prone after meals. 2 discuss with Dr. Jena Gauss further after abdominal ultrasound results.

## 2010-12-04 NOTE — Assessment & Plan Note (Signed)
Chronic hepatitis C. Patient previously declined treatment for fear of debilitating symptoms and the fact that he is a single parent. The last time he discussed treatment options with hepatitis clinic was 7 or 8 years ago according to his report. At this time he continues to decline treatment. We discussed that given his age and the fact that he is not have any evidence of cirrhosis at this point, treatment may or may not be recommended. If he changes his mind he will let us know. At a minimum he should have his hepatoma surveillance including abdominal ultrasound and AFP done every 6 months. See assessment and plan for elevated AFP.

## 2010-12-04 NOTE — Assessment & Plan Note (Signed)
Refer to assessment and plan for epigastric pain. 

## 2010-12-04 NOTE — Progress Notes (Signed)
Cc to Dr. Margo Common

## 2010-12-04 NOTE — Progress Notes (Signed)
Primary Care Physician: Louie Boston, MD  Primary Gastroenterologist:  Roetta Sessions, MD   Chief Complaint  Patient presents with  . Follow-up    HPI: Mathew Grant is a 60 y.o. male here because of history of elevated alpha-fetoprotein tumor marker. We received a request from Dr. Jackolyn Confer office to manage patient's abnormal AFP and hepatitis C. Patient had given [place] the impression that he was being followed by Dr. Margo Common for his chronic hepatitis C since he had declined on treatment previously. As it turned out it had been over 5 years since any labs done with regards to hepatitis C. We rechecked his viral load which was positive. He also had a CT which showed show any signs of hepatoma or cirrhosis. Since his last office visit he also had a high risk surveillance colonoscopy for history of advanced adenomas, no polyps seen at this colonoscopy and followup recommended in 5 years.  He complains of epigastric pain, RUQ pain all worse with spicey, greasy foods. Nausea no vomiting. Lots of belching. No heartburn on Nexium. No dysphagia. BM regular with high fiber diet. No melena, brbpr. Fatigue bad. Has to lay down on stomach for 15 minutes after meals to prevent abdominal pain.  No h/o heart disease but he has had several strokes years ago. Dad had CABG 4v. Smoking 1ppd. No etoh for 7 years.    Current Outpatient Prescriptions  Medication Sig Dispense Refill  . atenolol (TENORMIN) 50 MG tablet Take 50 mg by mouth daily.        Marland Kitchen atorvastatin (LIPITOR) 20 MG tablet Take 20 mg by mouth daily.        Marland Kitchen NEXIUM 40 MG capsule TAKE 1 CAPSULE EVERY DAY  31 each  11  . oxyCODONE-acetaminophen (PERCOCET) 7.5-325 MG per tablet Take 1 tablet by mouth every 4 (four) hours as needed.          Allergies as of 12/04/2010  . (No Known Allergies)    ROS:  General: Negative for anorexia, fever, chills. Complains of ongoing weight loss. Weight is down 2 more pounds. Weight down more than 30 pounds over  the last several years. Complains of fatigue. ENT: Negative for hoarseness, difficulty swallowing , nasal congestion. CV: Negative for chest pain, angina, palpitations, dyspnea on exertion, peripheral edema.  Respiratory: Negative for dyspnea at rest, dyspnea on exertion, cough, sputum, wheezing.  GI: See history of present illness. GU:  Negative for dysuria, hematuria, urinary incontinence, urinary frequency, nocturnal urination.  Endo: Complains of ongoing weight loss, weight down 2 more pounds for a total of over 30 pounds in the last several years.    Physical Examination:   BP 134/77  Pulse 64  Temp(Src) 97.8 F (36.6 C) (Temporal)  Ht 5\' 9"  (1.753 m)  Wt 147 lb 6.4 oz (66.86 kg)  BMI 21.77 kg/m2  General: Well-nourished, well-developed in no acute distress.  Eyes: No icterus. Mouth: Oropharyngeal mucosa moist and pink , no lesions erythema or exudate. Lungs: Clear to auscultation bilaterally.  Heart: Regular rate and rhythm, no murmurs rubs or gallops.  Abdomen: Bowel sounds are normal, nondistended, no hepatosplenomegaly or masses, no abdominal bruits or hernia. Moderate epigastric tenderness without rebound or guarding.   Extremities: No lower extremity edema. No deformity or clubbing  Neuro: Alert and oriented x 4   Skin: Warm and dry, no jaundice.   Psych: Alert and cooperative, normal mood and affect.  Labs:  Recent Results (from the past 672 hour(s))  HCV RNA QUANT  RFLX ULTRA OR GENOTYP   Collection Time   11/22/10 11:50 AM      Component Value Range   HCV Quantitative [id number] (*) <43 (IU/mL)   HCV Quantitative Log 7.07 (*) <1.63 (log 10)  HEPATITIS C GENOTYPE   Collection Time   11/22/10 11:50 AM      Component Value Range   HCV Genotype 1a       Imaging Studies: Ct Abd Wo & W Cm  11/22/2010  *RADIOLOGY REPORT*  Clinical Data: Upper abdominal pain for 1 year, history of prostate cancer  CT ABDOMEN WITHOUT AND WITH CONTRAST  Technique:  Multidetector CT  imaging of the abdomen was performed following the standard protocol before and during bolus administration of intravenous contrast. Sagittal and coronal MPR images reconstructed from axial data set.  Contrast: Dilute oral contrast. 100 ml Omnipaque 300 IV.  Comparison: None.  Findings: Lung bases clear. Probable tiny splenules adjacent to spleen. Liver, spleen, pancreas, kidneys, and adrenal glands normal appearance. Stomach and visualized bowel loops normal appearance. Aorta normal caliber. No mass, adenopathy, free fluid or inflammatory process. Left spondylolysis L5, right L5 incompletely visualized. Lucent foci in ilia bilaterally, uncertain etiology.  IMPRESSION: No acute intra abdominal abnormalities. Lucent foci in the ilia bilaterally, nonspecific but stable since prior study.  Original Report Authenticated By: Lollie Marrow, M.D.

## 2010-12-04 NOTE — Assessment & Plan Note (Addendum)
New elevated alpha-fetoprotein tumor marker in the setting of chronic hepatitis C. No evidence of hepatoma or cirrhosis on recent CT. Recheck alpha-fetoprotein tumor marker in September 2012. Office visit with Dr. Jena Gauss in 3 months.

## 2010-12-04 NOTE — Assessment & Plan Note (Addendum)
Next colonoscopy due in June 2017.

## 2010-12-05 ENCOUNTER — Ambulatory Visit (HOSPITAL_COMMUNITY)
Admission: RE | Admit: 2010-12-05 | Discharge: 2010-12-05 | Disposition: A | Discharge status: Home / Self Care | Payer: Medicaid Other | Source: Ambulatory Visit | Attending: Gastroenterology | Admitting: Gastroenterology

## 2010-12-05 DIAGNOSIS — R1013 Epigastric pain: Secondary | ICD-10-CM | POA: Insufficient documentation

## 2010-12-05 DIAGNOSIS — B1920 Unspecified viral hepatitis C without hepatic coma: Secondary | ICD-10-CM | POA: Insufficient documentation

## 2010-12-13 ENCOUNTER — Encounter: Payer: Self-pay | Admitting: General Practice

## 2010-12-13 NOTE — Progress Notes (Signed)
Quick Note:  Please ask pt to take Nexium 40mg  po bid (30 mins before a meal). If not better by next week, then consider EGD. ______

## 2010-12-13 NOTE — Progress Notes (Signed)
Pt is scheduled for Hida Scan on 12/15/2010@ 8:00am. Pt needs to be NPO after MN.

## 2010-12-13 NOTE — Progress Notes (Signed)
HIDA cancelled. Done in 2011.

## 2010-12-13 NOTE — Progress Notes (Signed)
I called and cx hida scan per Hendricks Limes.  Pt just had exam in 07/2009

## 2010-12-13 NOTE — Progress Notes (Signed)
Addended by: Jennings Books on: 12/13/2010 01:23 PM   Modules accepted: Orders

## 2010-12-15 ENCOUNTER — Encounter (HOSPITAL_COMMUNITY): Payer: Medicaid Other

## 2010-12-20 ENCOUNTER — Telehealth: Payer: Self-pay

## 2010-12-20 NOTE — Telephone Encounter (Signed)
Pt called (RMR pt) pt has been taking nexium bid and he is still having a lot of abd pain and vomiting. Pt cant eat and can only keep water down. Please see U/S notes. Please advise.

## 2010-12-20 NOTE — Telephone Encounter (Signed)
Pt aware, rx called to [facility]. Called in # 30 per AS.  Please schedule egd. Pt is concerned whether or not medicaid will pay for it again.

## 2010-12-20 NOTE — Telephone Encounter (Signed)
Please call pt. Let him Dr. Jena Gauss is off until MON. He is on Nexium for reflux but it sounds like he's having a flare of his Sx. Schedule pt for EGD on 8/13. He may use Phenergan 25 mg 1/2 to 1 tab po q4-6H prn for nausea or vomiting. Follow a full liquid diet. He may pick up a HO.

## 2010-12-25 NOTE — Telephone Encounter (Signed)
Pt stated he can't do this test right now.  He has a lot going on right now and he will call the office back when he is ready.

## 2010-12-26 ENCOUNTER — Other Ambulatory Visit: Payer: Self-pay

## 2010-12-26 DIAGNOSIS — R112 Nausea with vomiting, unspecified: Secondary | ICD-10-CM

## 2010-12-26 NOTE — Telephone Encounter (Signed)
Pt is scheduled for egd 01/03/2011@10 :00am.  Pt is aware of appt and instructions placed in the mail.  Pt would like some samples of Nexium to last him until his next refill which is 01/02/2011.

## 2010-12-26 NOTE — Telephone Encounter (Signed)
Message copied by Jennings Books on Tue Dec 26, 2010  4:40 PM ------      Message from: Myra Rude      Created: Tue Dec 26, 2010  3:17 PM       Pt was returning your call.

## 2010-12-27 NOTE — Telephone Encounter (Signed)
Samples at front desk 

## 2010-12-28 MED ORDER — ESOMEPRAZOLE MAGNESIUM 40 MG PO CPDR: 40.0000 mg | DELAYED_RELEASE_CAPSULE | Freq: Two times a day (BID) | ORAL | Status: DC

## 2011-01-02 MED ORDER — SODIUM CHLORIDE 0.45 % IV SOLN
Freq: Once | INTRAVENOUS | Status: AC
  Administered 2011-01-03: 10:00:00 via INTRAVENOUS

## 2011-01-03 ENCOUNTER — Ambulatory Visit (HOSPITAL_COMMUNITY)
Admission: RE | Admit: 2011-01-03 | Discharge: 2011-01-03 | Disposition: A | Discharge status: Home / Self Care | Payer: Medicaid Other | Source: Ambulatory Visit | Attending: Internal Medicine | Admitting: Internal Medicine

## 2011-01-03 ENCOUNTER — Other Ambulatory Visit: Payer: Self-pay | Admitting: Internal Medicine

## 2011-01-03 ENCOUNTER — Encounter (HOSPITAL_COMMUNITY)
Admission: RE | Disposition: A | Discharge status: Home / Self Care | Payer: Self-pay | Source: Ambulatory Visit | Attending: Internal Medicine

## 2011-01-03 ENCOUNTER — Encounter (HOSPITAL_COMMUNITY): Payer: Self-pay | Admitting: *Deleted

## 2011-01-03 DIAGNOSIS — K449 Diaphragmatic hernia without obstruction or gangrene: Secondary | ICD-10-CM

## 2011-01-03 DIAGNOSIS — R1013 Epigastric pain: Secondary | ICD-10-CM | POA: Insufficient documentation

## 2011-01-03 DIAGNOSIS — K259 Gastric ulcer, unspecified as acute or chronic, without hemorrhage or perforation: Secondary | ICD-10-CM

## 2011-01-03 DIAGNOSIS — R109 Unspecified abdominal pain: Secondary | ICD-10-CM

## 2011-01-03 DIAGNOSIS — R110 Nausea: Secondary | ICD-10-CM | POA: Insufficient documentation

## 2011-01-03 DIAGNOSIS — K2940 Chronic atrophic gastritis without bleeding: Secondary | ICD-10-CM | POA: Insufficient documentation

## 2011-01-03 DIAGNOSIS — R1011 Right upper quadrant pain: Secondary | ICD-10-CM | POA: Insufficient documentation

## 2011-01-03 DIAGNOSIS — K2960 Other gastritis without bleeding: Secondary | ICD-10-CM

## 2011-01-03 HISTORY — PX: ESOPHAGOGASTRODUODENOSCOPY: SHX5428

## 2011-01-03 HISTORY — DX: Gastric ulcer, unspecified as acute or chronic, without hemorrhage or perforation: K25.9

## 2011-01-03 HISTORY — DX: Diaphragmatic hernia without obstruction or gangrene: K44.9

## 2011-01-03 SURGERY — EGD (ESOPHAGOGASTRODUODENOSCOPY)
Anesthesia: Moderate Sedation

## 2011-01-03 MED ORDER — MEPERIDINE HCL 100 MG/ML IJ SOLN
INTRAMUSCULAR | Status: AC
  Filled 2011-01-03: qty 2

## 2011-01-03 MED ORDER — MEPERIDINE HCL 100 MG/ML IJ SOLN
INTRAMUSCULAR | Status: DC | PRN
  Administered 2011-01-03 (×2): 50 mg via INTRAVENOUS

## 2011-01-03 MED ORDER — MIDAZOLAM HCL 5 MG/5ML IJ SOLN
INTRAMUSCULAR | Status: AC
  Filled 2011-01-03: qty 10

## 2011-01-03 MED ORDER — MIDAZOLAM HCL 5 MG/5ML IJ SOLN
INTRAMUSCULAR | Status: DC | PRN
  Administered 2011-01-03 (×2): 2 mg via INTRAVENOUS

## 2011-01-03 MED ORDER — BUTAMBEN-TETRACAINE-BENZOCAINE 2-2-14 % EX AERO
INHALATION_SPRAY | CUTANEOUS | Status: DC | PRN
  Administered 2011-01-03: 2 via TOPICAL

## 2011-01-03 NOTE — Progress Notes (Signed)
  Discussed the CT scan with Dr. Tyron Russell. 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 He has a nice fat plane between the SMA and the duodenum not likely having a set up for SMA syndrome. A mechanical/vascular etiology for patient's pain unlikely based on today's EGD and findings of recent CT.

## 2011-01-03 NOTE — H&P (Signed)
Tana Coast, PA  12/04/2010  1:05 PM  Signed Primary Care Physician: Louie Boston, MD   Primary Gastroenterologist:  Roetta Sessions, MD      Chief Complaint   Patient presents with   .  Follow-up      HPI: Mathew Grant is a 60 y.o. male here because of history of elevated alpha-fetoprotein tumor marker. We received a request from Dr. Jackolyn Confer office to manage patient's abnormal AFP and hepatitis C. Patient had given [place] the impression that he was being followed by Dr. Margo Common for his chronic hepatitis C since he had declined on treatment previously. As it turned out it had been over 5 years since any labs done with regards to hepatitis C. We rechecked his viral load which was positive. He also had a CT which showed show any signs of hepatoma or cirrhosis. Since his last office visit he also had a high risk surveillance colonoscopy for history of advanced adenomas, no polyps seen at this colonoscopy and followup recommended in 5 years.   He complains of epigastric pain, RUQ pain all worse with spicey, greasy foods. Nausea no vomiting. Lots of belching. No heartburn on Nexium. No dysphagia. BM regular with high fiber diet. No melena, brbpr. Fatigue bad. Has to lay down on stomach for 15 minutes after meals to prevent abdominal pain.   No h/o heart disease but he has had several strokes years ago. Dad had CABG 4v. Smoking 1ppd. No etoh for 7 years.       Current Outpatient Prescriptions   Medication  Sig  Dispense  Refill   .  atenolol (TENORMIN) 50 MG tablet  Take 50 mg by mouth daily.           Marland Kitchen  atorvastatin (LIPITOR) 20 MG tablet  Take 20 mg by mouth daily.           Marland Kitchen  NEXIUM 40 MG capsule  TAKE 1 CAPSULE EVERY DAY   31 each   11   .  oxyCODONE-acetaminophen (PERCOCET) 7.5-325 MG per tablet  Take 1 tablet by mouth every 4 (four) hours as needed.               Allergies as of 12/04/2010   .  (No Known Allergies)      ROS:   General: Negative for anorexia, fever, chills.  Complains of ongoing weight loss. Weight is down 2 more pounds. Weight down more than 30 pounds over the last several years. Complains of fatigue. ENT: Negative for hoarseness, difficulty swallowing , nasal congestion. CV: Negative for chest pain, angina, palpitations, dyspnea on exertion, peripheral edema.   Respiratory: Negative for dyspnea at rest, dyspnea on exertion, cough, sputum, wheezing.   GI: See history of present illness. GU:  Negative for dysuria, hematuria, urinary incontinence, urinary frequency, nocturnal urination.   Endo: Complains of ongoing weight loss, weight down 2 more pounds for a total of over 30 pounds in the last several years.    Physical Examination:    BP 134/77  Pulse 64  Temp(Src) 97.8 F (36.6 C) (Temporal)  Ht 5\' 9"  (1.753 m)  Wt 147 lb 6.4 oz (66.86 kg)  BMI 21.77 kg/m2   General: Well-nourished, well-developed in no acute distress.   Eyes: No icterus. Mouth: Oropharyngeal mucosa moist and pink , no lesions erythema or exudate. Lungs: Clear to auscultation bilaterally.   Heart: Regular rate and rhythm, no murmurs rubs or gallops.   Abdomen: Bowel sounds are normal, nondistended, no hepatosplenomegaly  or masses, no abdominal bruits or hernia. Moderate epigastric tenderness without rebound or guarding.    Extremities: No lower extremity edema. No deformity or clubbing   Neuro: Alert and oriented x 4    Skin: Warm and dry, no jaundice.    Psych: Alert and cooperative, normal mood and affect.   Labs:   Recent Results (from the past 672 hour(s))   HCV RNA QUANT RFLX ULTRA OR GENOTYP     Collection Time     11/22/10 11:50 AM       Component  Value  Range     HCV Quantitative  [id number] (*)  <43 (IU/mL)     HCV Quantitative Log  7.07 (*)  <1.63 (log 10)   HEPATITIS C GENOTYPE     Collection Time     11/22/10 11:50 AM       Component  Value  Range     HCV Genotype  1a          Imaging Studies: Ct Abd Wo & W Cm   11/22/2010  *RADIOLOGY REPORT*   Clinical Data: Upper abdominal pain for 1 year, history of prostate cancer  CT ABDOMEN WITHOUT AND WITH CONTRAST  Technique:  Multidetector CT imaging of the abdomen was performed following the standard protocol before and during bolus administration of intravenous contrast. Sagittal and coronal MPR images reconstructed from axial data set.  Contrast: Dilute oral contrast. 100 ml Omnipaque 300 IV.  Comparison: None.  Findings: Lung bases clear. Probable tiny splenules adjacent to spleen. Liver, spleen, pancreas, kidneys, and adrenal glands normal appearance. Stomach and visualized bowel loops normal appearance. Aorta normal caliber. No mass, adenopathy, free fluid or inflammatory process. Left spondylolysis L5, right L5 incompletely visualized. Lucent foci in ilia bilaterally, uncertain etiology.  IMPRESSION: No acute intra abdominal abnormalities. Lucent foci in the ilia bilaterally, nonspecific but stable since prior study.  Original Report Authenticated By: Lollie Marrow, M.D.                 Ave Filter  12/13/2010  1:23 PM  Signed Addended by: Jennings Books on: 12/13/2010 01:23 PM       Modules accepted: Orders    Ave Filter  12/13/2010  1:24 PM  Signed Pt is scheduled for Hida Scan on 12/15/2010@ 8:00am. Pt needs to be NPO after MN.  Tana Coast, PA  12/13/2010  5:49 PM  Signed Quick Note:   Please ask pt to take Nexium 40mg  po bid (30 mins before a meal). If not better by next week, then consider EGD. ______  Tana Coast, PA  12/13/2010  5:54 PM  Signed HIDA cancelled. Done in 2011.        EPIGASTRIC PAIN - Tana Coast, PA  12/04/2010 12:57 PM  Signed Postprandial epigastric pain, progressive. Improves with lying prone after meals. Recent CT did not reveal a cause of his pain, this was done for elevated alpha-fetoprotein tumor marker. Gallbladder remains in situ. 30% of stones will not show up on a CT. Check an abdominal ultrasound. He is a chronic smoker. He also has  chronic progressive weight loss. Cannot rule out mesenteric ischemia, at least no indication reported on current CT modality. Question SMA syndrome given symptoms improve when he lies prone after meals. 2 discuss with Dr. Jena Gauss further after abdominal ultrasound results.  WEIGHT LOSS, ABNORMAL - Tana Coast, PA  12/04/2010 12:58 PM  Signed Refer to assessment and plan for epigastric pain.  HCV (hepatitis C virus) -  Tana Coast, PA  12/04/2010  1:00 PM  Signed Chronic hepatitis C. Patient previously declined treatment for fear of debilitating symptoms and the fact that he is a single parent. The last time he discussed treatment options with hepatitis clinic was 7 or 8 years ago according to his report. At this time he continues to decline treatment. We discussed that given his age and the fact that he is not have any evidence of cirrhosis at this point, treatment may or may not be recommended. If he changes his mind he will let us know. At a minimum he should have his hepatoma surveillance including abdominal ultrasound and AFP done every 6 months. See assessment and plan for elevated AFP.  Elevated AFP - Tana Coast, PA  12/04/2010  1:02 PM  Addendum New elevated alpha-fetoprotein tumor marker in the setting of chronic hepatitis C. No evidence of hepatoma or cirrhosis on recent CT. Recheck alpha-fetoprotein tumor marker in September 2012. Office visit with Dr. Jena Gauss in 3 months.  Previous Version  Hx of adenomatous colonic polyps - Tana Coast, [place]  12/04/2010  1:03 PM  Addendum Next colonoscopy due in June 2017.    I have seen the patient prior to the procedure(s) today and reviewed the history and physical / consultation from 01/04/11.  Ultrasound negative for occult biliary tract disease. Patient continues to have postprandial epigastric pain. Some relief with lying.&  There have been no changes otherwise. After consideration of the risks, benefits, alternatives and imponderables, the patient has  consented to the procedure(s).

## 2011-01-09 ENCOUNTER — Encounter (HOSPITAL_COMMUNITY): Payer: Self-pay | Admitting: Internal Medicine

## 2011-02-06 ENCOUNTER — Encounter: Payer: Self-pay | Admitting: Internal Medicine

## 2011-04-23 ENCOUNTER — Telehealth: Payer: Self-pay

## 2011-04-23 MED ORDER — PANTOPRAZOLE SODIUM 40 MG PO TBEC: 40.0000 mg | DELAYED_RELEASE_TABLET | Freq: Two times a day (BID) | ORAL | Status: DC

## 2011-04-23 NOTE — Telephone Encounter (Signed)
Pt is unable to get his nexium d/t medicaid changing their formulary. Pt has tried omeprazole bid but has never tried protonix. Pt is willing to try protonix. Advised him that if it doesn't work, we can do a PA for the nexium.   Pt uses [facility] [place].

## 2011-04-23 NOTE — Telephone Encounter (Signed)
Called in Protonix 40 BID X 1 mos. When this is completed, PA for Nexium could be attempted again as he would have failed Prilosec and Protonix.

## 2011-05-04 ENCOUNTER — Ambulatory Visit (INDEPENDENT_AMBULATORY_CARE_PROVIDER_SITE_OTHER): Payer: Medicaid Other | Admitting: Internal Medicine

## 2011-05-04 ENCOUNTER — Encounter: Payer: Self-pay | Admitting: Internal Medicine

## 2011-05-04 VITALS — BP 144/86 | HR 65 | Temp 98.3°F | Ht 69.0 in | Wt 149.0 lb

## 2011-05-04 DIAGNOSIS — R634 Abnormal weight loss: Secondary | ICD-10-CM

## 2011-05-04 NOTE — Assessment & Plan Note (Signed)
   Patient has gained a couple pounds since July of this year. He has symptoms have settled down back on Nexium. He has had an extensive evaluation without organic cause of his symptoms being found. His abdominal discomfort falls into the nebulous realm of non-ulcer dyspepsia. I suspect now there is a functional component to his symptoms. However, chronic HCV infection could cause a myriad of nonspecific constitutional symptoms.  Recommendations: Continue Nexium 40 mg orally daily. I provided him samples through the office. I will plan to see him back in a relatively short interval(ie1 month) to check his weight and see how he is doing

## 2011-05-04 NOTE — Progress Notes (Signed)
Primary Care Physician:  Louie Boston, MD Primary Gastroenterologist:  Dr. Jena Gauss  Pre-Procedure History & Physical: HPI:  Mathew Grant is a 60 y.o. male here for followup of abdominal pain weight loss. States overall bowel pain is much better back on Nexium 40 mg once daily. He is stretched thinly these days with his obligations being a single parent taking care of his children. History of HCV -  declined treatment. States protonix caused worsening of abdominal bloating. He stopped this on his own and is now back on Nexium 40 mg orally daily; he's had a fairly extensive workup recently with EGD, colonoscopy and CAT scan. Ultrasound/ HIDA also previously normal/negative. He's not having any nausea vomiting melena or hematochezia. He has actually gained 2 pounds since July of this year according to prior records  Past Medical History  Diagnosis Date  . CVA (cerebral infarction)   . Arthritis   . HCV (hepatitis C virus)     Patient previously declined treatment.  Marland Kitchen HTN (hypertension)   . GERD (gastroesophageal reflux disease)   . Personal history of colonic polyps   . Prostate cancer   . Carpal tunnel syndrome   . Lytic lesion of bone on x-ray   . Elevated AFP 10/2010    11.9 (2 in 2011)  . Hiatal hernia 01/03/11    egd  . Gastric erosions 01/03/11    egd- antral and duodenal erosions    Past Surgical History  Procedure Date  . Colonoscopy 2006    [facility], high grade adenomas  . Transurethral resection of prostate 2008  . Amputation of four fingers left hand   . Esophagogastroduodenoscopy 05/2009    mild erosive reflux esophagitis, peptic duodenitis, chronic gastritis. No H. Pylori.  . Colonoscopy 10/2010    normal, next TCS 10/2015 for h/o advanced adenomas  . Esophagogastroduodenoscopy 01/03/2011    Procedure: ESOPHAGOGASTRODUODENOSCOPY (EGD);  Surgeon: Corbin Ade, MD;  Location: [facility];  Service: Endoscopy;  Laterality: N/A;  10:00am    Prior to Admission medications     Medication Sig Start Date End Date Taking? Authorizing Provider  atenolol (TENORMIN) 50 MG tablet Take 100 mg by mouth daily.    Yes Historical Provider, MD  esomeprazole (NEXIUM) 40 MG capsule Take 40 mg by mouth daily before breakfast.     Yes Historical Provider, MD  lisinopril (PRINIVIL,ZESTRIL) 40 MG tablet Take 40 mg by mouth daily.     Yes Historical Provider, MD  oxyCODONE-acetaminophen (PERCOCET) 7.5-325 MG per tablet Take 1 tablet by mouth every 4 (four) hours as needed.     Yes Historical Provider, MD  atorvastatin (LIPITOR) 20 MG tablet Take 20 mg by mouth daily.      Historical Provider, MD  pantoprazole (PROTONIX) 40 MG tablet Take 1 tablet (40 mg total) by mouth 2 (two) times daily. 30 minutes before breakfast and dinner 04/23/11 04/22/12  Gerrit Halls, NP    Allergies as of 05/04/2011  . (No Known Allergies)    Family History  Problem Relation Age of Onset  . Heart disease Father   . Diabetes Mother   . Colon cancer Neg Hx     History   Social History  . Marital Status: Divorced    Spouse Name: N/A    Number of Children: 3  . Years of Education: N/A   Occupational History  . disabled    Social History Main Topics  . Smoking status: Current Everyday Smoker -- 1.0 packs/day for 10 years  Types: Cigarettes  . Smokeless tobacco: Not on file  . Alcohol Use: No     none in over six years  . Drug Use: No  . Sexually Active: Not on file   Other Topics Concern  . Not on file   Social History Narrative  . No narrative on file    Review of Systems: See HPI, otherwise negative ROS  Physical Exam: BP 144/86  Pulse 65  Temp(Src) 98.3 F (36.8 C) (Temporal)  Ht 5\' 9"  (1.753 m)  Wt 149 lb (67.586 kg)  BMI 22.00 kg/m2 General:   Alert,  Well-developed, well-nourished, pleasant and cooperative in NAD Skin:  Intact without significant lesions or rashes. Eyes:  Sclera clear, no icterus.   Conjunctiva pink. Ears:  Normal auditory acuity. Nose:  No deformity,  discharge,  or lesions. Mouth:  No deformity or lesions. Neck:  Supple; no masses or thyromegaly. No significant cervical adenopathy. Lungs:  Clear throughout to auscultation.   No wheezes, crackles, or rhonchi. No acute distress. Heart:  Regular rate and rhythm; no murmurs, clicks, rubs,  or gallops. Abdomen: Non-distended, normal bowel sounds.  Soft and nontender without appreciable mass or hepatosplenomegaly.  Pulses:  Normal pulses noted. Extremities:  Without clubbing or edema.  Impression/Plan:

## 2011-05-04 NOTE — Patient Instructions (Signed)
Continue adequate nutritional intake.  Stop Protonix as you have done, continue Nexium 40 mg orally daily  I'd like for you come back in one month to check your weight and see how you are doing  Have a Altamese Cabal Christmas!

## 2011-05-15 DIAGNOSIS — D494 Neoplasm of unspecified behavior of bladder: Secondary | ICD-10-CM

## 2011-05-15 HISTORY — DX: Neoplasm of unspecified behavior of bladder: D49.4

## 2011-05-22 ENCOUNTER — Telehealth: Payer: Self-pay | Admitting: Gastroenterology

## 2011-05-22 MED ORDER — ESOMEPRAZOLE MAGNESIUM 40 MG PO CPDR: 40.0000 mg | DELAYED_RELEASE_CAPSULE | Freq: Every day | ORAL | Status: DC

## 2011-05-22 NOTE — Telephone Encounter (Signed)
Pt is out of samples of nexium- needs Rx sent to [facility] in [place]

## 2011-05-31 ENCOUNTER — Other Ambulatory Visit: Payer: Self-pay | Admitting: Cardiovascular Disease

## 2011-05-31 ENCOUNTER — Telehealth: Payer: Self-pay | Admitting: *Deleted

## 2011-05-31 ENCOUNTER — Ambulatory Visit (INDEPENDENT_AMBULATORY_CARE_PROVIDER_SITE_OTHER): Payer: Medicaid Other | Admitting: Cardiovascular Disease

## 2011-05-31 ENCOUNTER — Encounter: Payer: Self-pay | Admitting: *Deleted

## 2011-05-31 ENCOUNTER — Encounter: Payer: Self-pay | Admitting: Cardiovascular Disease

## 2011-05-31 DIAGNOSIS — R079 Chest pain, unspecified: Secondary | ICD-10-CM | POA: Insufficient documentation

## 2011-05-31 DIAGNOSIS — I10 Essential (primary) hypertension: Secondary | ICD-10-CM

## 2011-05-31 DIAGNOSIS — F17200 Nicotine dependence, unspecified, uncomplicated: Secondary | ICD-10-CM

## 2011-05-31 DIAGNOSIS — Z720 Tobacco use: Secondary | ICD-10-CM

## 2011-05-31 NOTE — Assessment & Plan Note (Signed)
His blood pressure is mildly elevated. He complains of side effects to lisinopril. No changes in his medications were made today. We'll evaluate his response to exercise during this stress test.

## 2011-05-31 NOTE — Telephone Encounter (Signed)
No precert required 

## 2011-05-31 NOTE — Progress Notes (Signed)
HPI  Mathew Grant is the son of Mathew Grant who is one of my patients. He is referred for evaluation of chest pain. He denies any previous cardiac history. He has chronic medical conditions that include hypertension, tobacco use and gastroesophageal reflux disease. He also has hepatitis C that he declined treatment for. He had a CVA in 2003. The patient has suffered from abdominal pain for many years thought to be due to GI issues and gastroesophageal reflux disease. However, over the last few months he had few episodes of chest pain which was left-sided. He had about 3-4 episodes. All these episodes happen at rest while he was under mental stress. They lasted for about 15 minutes. He had no episodes with physical activities. He denies any change in his dyspnea. There has been no palpitations, syncope or presyncope. He said that he actually feels better with physical activities. He had no recent cardiac evaluation.  No Known Allergies   Current Outpatient Prescriptions on File Prior to Visit  Medication Sig Dispense Refill  . atenolol (TENORMIN) 50 MG tablet Take 100 mg by mouth daily.       Marland Kitchen esomeprazole (NEXIUM) 40 MG capsule Take 1 capsule (40 mg total) by mouth daily before breakfast.  30 capsule  5  . oxyCODONE-acetaminophen (PERCOCET) 7.5-325 MG per tablet Take 1 tablet by mouth every 8 (eight) hours as needed.          Past Medical History  Diagnosis Date  . CVA (cerebral infarction)   . Arthritis   . HCV (hepatitis C virus)     Patient previously declined treatment.  Marland Kitchen GERD (gastroesophageal reflux disease)   . Personal history of colonic polyps   . Prostate cancer   . Carpal tunnel syndrome   . Lytic lesion of bone on x-ray   . Elevated AFP 10/2010    11.9 (2 in 2011)  . Hiatal hernia 01/03/11    egd  . Gastric erosions 01/03/11    egd- antral and duodenal erosions  . Tobacco abuse   . HTN (hypertension)      Past Surgical History  Procedure Date  . Colonoscopy 2006   [facility], high grade adenomas  . Transurethral resection of prostate 2008  . Amputation of four fingers left hand   . Esophagogastroduodenoscopy 05/2009    mild erosive reflux esophagitis, peptic duodenitis, chronic gastritis. No H. Pylori.  . Colonoscopy 10/2010    normal, next TCS 10/2015 for h/o advanced adenomas  . Esophagogastroduodenoscopy 01/03/2011    Procedure: ESOPHAGOGASTRODUODENOSCOPY (EGD);  Surgeon: Corbin Ade, MD;  Location: [facility];  Service: Endoscopy;  Laterality: N/A;  10:00am     Family History  Problem Relation Age of Onset  . Heart disease Father   . Diabetes Mother   . Colon cancer Neg Hx      History   Social History  . Marital Status: Divorced    Spouse Name: N/A    Number of Children: 3  . Years of Education: N/A   Occupational History  . disabled    Social History Main Topics  . Smoking status: Current Everyday Smoker -- 1.0 packs/day for 30 years    Types: Cigarettes  . Smokeless tobacco: Never Used  . Alcohol Use: No     none in over six years  . Drug Use: No  . Sexually Active: Not on file   Other Topics Concern  . Not on file   Social History Narrative  . No narrative on file  ROS Constitutional: Negative for fever, chills, diaphoresis, activity change, appetite change and fatigue.  HENT: Negative for hearing loss, nosebleeds, congestion, sore throat, facial swelling, drooling, trouble swallowing, neck pain, voice change, sinus pressure and tinnitus.  Eyes: Negative for photophobia, pain, discharge and visual disturbance.  Respiratory: Negative for apnea, cough and wheezing.  Cardiovascular: Negative for palpitations and leg swelling.  Gastrointestinal: Negative for nausea, vomiting, abdominal pain, diarrhea, constipation, blood in stool and abdominal distention.  Genitourinary: Negative for dysuria, urgency, frequency, hematuria and decreased urine volume.  Musculoskeletal: Negative for myalgias, back pain, joint  swelling, arthralgias and gait problem.  Skin: Negative for color change, pallor, rash and wound.  Neurological: Negative for dizziness, tremors, seizures, syncope, speech difficulty, weakness, light-headedness, numbness and headaches.  Psychiatric/Behavioral: Negative for suicidal ideas, hallucinations, behavioral problems and agitation. The patient is not nervous/anxious.     PHYSICAL EXAM   BP 149/89  Pulse 58  Ht 5\' 8"  (1.727 m)  Wt 146 lb (66.225 kg)  BMI 22.20 kg/m2 Constitutional: He is oriented to person, place, and time. He appears well-developed and well-nourished. No distress.  HENT: No nasal discharge.  Head: Normocephalic and atraumatic.  Eyes: Pupils are equal, round, and reactive to light. Right eye exhibits no discharge. Left eye exhibits no discharge.  Neck: Normal range of motion. Neck supple. No JVD present. No thyromegaly present.  Cardiovascular: Normal rate, regular rhythm, normal heart sounds. Exam reveals no gallop and no friction rub.  No murmur heard.  Pulmonary/Chest: Effort normal and breath sounds normal. No stridor. No respiratory distress. He has no wheezes. He has no rales. He exhibits no tenderness.  Abdominal: Soft. Bowel sounds are normal. He exhibits no distension. There is no tenderness. There is no rebound and no guarding.  Musculoskeletal: Normal range of motion. He exhibits no edema and no tenderness.  Neurological: He is alert and oriented to person, place, and time. Coordination normal.  Skin: Skin is warm and dry. No rash noted. He is not diaphoretic. No erythema. No pallor.  Psychiatric: He has a normal mood and affect. His behavior is normal. Judgment and thought content normal.       EKG: Normal sinus rhythm with no significant ST or T wave changes. No evidence of prior infarct. Normal QT interval.   ASSESSMENT AND PLAN

## 2011-05-31 NOTE — Telephone Encounter (Signed)
stress echocardiogram (diag) 786.50   Scheduled for 06-04-2011 @ [facility]

## 2011-05-31 NOTE — Assessment & Plan Note (Signed)
The patient's chest pain quality is overall atypical. He had these episodes at rest and not with physical activities. Mostly triggered by mental distress. Nonetheless, he has multiple risk factors for coronary artery disease. Thus, I recommend proceeding with a stress echocardiogram for further evaluation. If the stress test comes back normal, then I don't recommend further cardiac evaluation at this time. I discussed with him the importance of lifestyle modifications especially with his family history of coronary artery disease.

## 2011-05-31 NOTE — Assessment & Plan Note (Signed)
I advised him strongly to quit smoking. 

## 2011-05-31 NOTE — Patient Instructions (Signed)
Your follow up will be based on your test results. Your physician recommends that you continue on your current medications as directed. Please refer to the Current Medication list given to you today. Your physician has requested that you have a stress echocardiogram. For further information please visit [email or web address] Please follow instruction sheet as given. If the results of your test are normal or stable, you will receive a letter. If they are abnormal, the nurse will contact you by phone.

## 2011-06-04 ENCOUNTER — Ambulatory Visit: Payer: Medicaid Other | Admitting: Gastroenterology

## 2011-06-04 DIAGNOSIS — R079 Chest pain, unspecified: Secondary | ICD-10-CM

## 2011-06-11 ENCOUNTER — Telehealth: Payer: Self-pay | Admitting: *Deleted

## 2011-06-11 NOTE — Telephone Encounter (Signed)
Pt's voicemail has not been set up, unable to leave message.

## 2011-06-11 NOTE — Telephone Encounter (Signed)
Message copied by Arlyss Gandy on Mon Jun 11, 2011  1:49 PM ------      Message from: [place], [place] A      Created: Fri Jun 08, 2011  2:08 PM       Inform patient that stress echo was fine but his blood pressure was very high. Stop Lisinopril and start Lotrel 5/20mg  once daily #30 with 3 refills. Follow up with me in few weeks.

## 2011-06-12 MED ORDER — AMLODIPINE BESY-BENAZEPRIL HCL 5-20 MG PO CAPS: 1.0000 | ORAL_CAPSULE | Freq: Every day | ORAL | Status: DC

## 2011-06-12 NOTE — Telephone Encounter (Signed)
Pt notified of results and verbalized understanding. Pt scheduled 06/28/11.

## 2011-06-28 ENCOUNTER — Ambulatory Visit (INDEPENDENT_AMBULATORY_CARE_PROVIDER_SITE_OTHER): Payer: Medicaid Other | Admitting: Cardiovascular Disease

## 2011-06-28 ENCOUNTER — Encounter: Payer: Self-pay | Admitting: Cardiovascular Disease

## 2011-06-28 DIAGNOSIS — I10 Essential (primary) hypertension: Secondary | ICD-10-CM

## 2011-06-28 DIAGNOSIS — R079 Chest pain, unspecified: Secondary | ICD-10-CM

## 2011-06-28 DIAGNOSIS — Z720 Tobacco use: Secondary | ICD-10-CM

## 2011-06-28 DIAGNOSIS — F17200 Nicotine dependence, unspecified, uncomplicated: Secondary | ICD-10-CM

## 2011-06-28 MED ORDER — AMLODIPINE BESY-BENAZEPRIL HCL 5-40 MG PO CAPS: 1.0000 | ORAL_CAPSULE | Freq: Every day | ORAL | Status: DC

## 2011-06-28 NOTE — Progress Notes (Signed)
HPI  This is a 61 year old man who is here today for a followup visit. He was seen recently for atypical chest pain. He underwent evaluation with a stress echocardiogram which showed no evidence of ischemia. His LV systolic function was normal with evidence of mild left ventricular hypertrophy. He had a deconditioned heart rate response to exercise as well as hypertensive response with systolic blood pressure above 454. Due to that, I switched him to Lotrel 5/20 mg once daily. The patient overall has been feeling reasonably well. He has not had any further chest pain. He has chronic exertional dyspnea but unfortunately he continues to smoke.  No Known Allergies   Current Outpatient Prescriptions on File Prior to Visit  Medication Sig Dispense Refill  . atenolol (TENORMIN) 50 MG tablet Take 100 mg by mouth daily.       Marland Kitchen esomeprazole (NEXIUM) 40 MG capsule Take 1 capsule (40 mg total) by mouth daily before breakfast.  30 capsule  5  . oxyCODONE-acetaminophen (PERCOCET) 7.5-325 MG per tablet Take 1 tablet by mouth every 8 (eight) hours as needed.          Past Medical History  Diagnosis Date  . CVA (cerebral infarction)   . Arthritis   . HCV (hepatitis C virus)     Patient previously declined treatment.  Marland Kitchen GERD (gastroesophageal reflux disease)   . Personal history of colonic polyps   . Prostate cancer   . Carpal tunnel syndrome   . Lytic lesion of bone on x-ray   . Elevated AFP 10/2010    11.9 (2 in 2011)  . Hiatal hernia 01/03/11    egd  . Gastric erosions 01/03/11    egd- antral and duodenal erosions  . Tobacco abuse   . HTN (hypertension)      Past Surgical History  Procedure Date  . Colonoscopy 2006    [facility], high grade adenomas  . Transurethral resection of prostate 2008  . Amputation of four fingers left hand   . Esophagogastroduodenoscopy 05/2009    mild erosive reflux esophagitis, peptic duodenitis, chronic gastritis. No H. Pylori.  . Colonoscopy 10/2010    normal,  next TCS 10/2015 for h/o advanced adenomas  . Esophagogastroduodenoscopy 01/03/2011    Procedure: ESOPHAGOGASTRODUODENOSCOPY (EGD);  Surgeon: Corbin Ade, MD;  Location: [facility];  Service: Endoscopy;  Laterality: N/A;  10:00am     Family History  Problem Relation Age of Onset  . Heart disease Father   . Diabetes Mother   . Colon cancer Neg Hx      History   Social History  . Marital Status: Divorced    Spouse Name: N/A    Number of Children: 3  . Years of Education: N/A   Occupational History  . disabled    Social History Main Topics  . Smoking status: Current Everyday Smoker -- 1.0 packs/day for 30 years    Types: Cigarettes  . Smokeless tobacco: Never Used  . Alcohol Use: No     none in over six years  . Drug Use: No  . Sexually Active: Not on file   Other Topics Concern  . Not on file   Social History Narrative  . No narrative on file     PHYSICAL EXAM   BP 149/87  Pulse 57  Ht 5\' 8"  (1.727 m)  Wt 148 lb (67.132 kg)  BMI 22.50 kg/m2  SpO2 98%  Constitutional: He is oriented to person, place, and time. He appears well-developed and well-nourished.  No distress.  HENT: No nasal discharge.  Head: Normocephalic and atraumatic.  Eyes: Pupils are equal and round. Right eye exhibits no discharge. Left eye exhibits no discharge.  Neck: Normal range of motion. Neck supple. No JVD present. No thyromegaly present.  Cardiovascular: Normal rate, regular rhythm, normal heart sounds and. Exam reveals no gallop and no friction rub. No murmur heard.  Pulmonary/Chest: Effort normal and breath sounds normal. No stridor. No respiratory distress. He has no wheezes. He has no rales. He exhibits no tenderness.  Abdominal: Soft. Bowel sounds are normal. He exhibits no distension. There is no tenderness. There is no rebound and no guarding.  Musculoskeletal: Normal range of motion. He exhibits no edema and no tenderness.  Neurological: He is alert and oriented to person,  place, and time. Coordination normal.  Skin: Skin is warm and dry. No rash noted. He is not diaphoretic. No erythema. No pallor.  Psychiatric: He has a normal mood and affect. His behavior is normal. Judgment and thought content normal.        ASSESSMENT AND PLAN

## 2011-06-28 NOTE — Assessment & Plan Note (Signed)
His chest pain has almost completely resolved. It was atypical in nature. It was mostly triggered by emotional stress. His stress echocardiogram showed no clear evidence of ischemia. He did not have chest pain during the stress test. There was evidence of physical deconditioning with accelerated heart rate response and hypertensive response. I discussed the results with him. Based on the above, I don't recommend cardiac catheterization at this time. However, the patient develops recurrent symptoms in the future, I would have a low threshold to proceed with cardiac catheterization due to his risk factors. I also explained to him that her stress test does not rule out the possibility of nonobstructive coronary artery disease. We discussed today the importance of controlling his risk factors and lifestyle changes especially smoking cessation.

## 2011-06-28 NOTE — Assessment & Plan Note (Signed)
His blood pressure is still not controlled. I will increase the dose of Lotrel to 5/40 mg once daily. I advised him to followup with Dr. Margo Common for further adjustment in his blood pressure medications. If his blood pressure remains elevated, one consideration would be to switch his atenolol to carvedilol.

## 2011-06-28 NOTE — Patient Instructions (Signed)
   Follow up as needed.  Change Lotrel to 5/40 mg daily. A new prescription was sent to your pharmacy to reflect this change. You will need to start new prescription. You will also need to obtain further refills from primary MD.

## 2011-06-28 NOTE — Assessment & Plan Note (Signed)
We again discussed the importance of smoking cessation today.

## 2011-11-28 ENCOUNTER — Other Ambulatory Visit: Payer: Self-pay | Admitting: Urology

## 2011-11-29 ENCOUNTER — Encounter (HOSPITAL_BASED_OUTPATIENT_CLINIC_OR_DEPARTMENT_OTHER): Payer: Self-pay | Admitting: *Deleted

## 2011-11-29 NOTE — Progress Notes (Signed)
Pt instructed npo p mn 7/21 x nexium w sip of water. No smoking, candy mint or gum.  To [facility] 7/22 @ 1030.  ekg w/ chart.  To [facility] tomorrow for cmet.  Needs hgb on arrival.

## 2011-11-30 LAB — COMPREHENSIVE METABOLIC PANEL
ALT: 18 U/L (ref 0–53)
AST: 19 U/L (ref 0–37)
CO2: 29 mEq/L (ref 19–32)
Calcium: 9.5 mg/dL (ref 8.4–10.5)
Creatinine, Ser: 0.8 mg/dL (ref 0.50–1.35)
GFR calc non Af Amer: >90 mL/min (ref >90)
Sodium: 136 mEq/L (ref 135–145)
Total Protein: 7.9 g/dL (ref 6.0–8.3)

## 2011-11-30 NOTE — H&P (Signed)
History of Present Illness        Gross hematuria:  He experienced gross hematuria and underwent a CT scan of the abdomen and pelvis and 7/09 it was noted to be negative as well as a negative renal ultrasound at that time. He underwent a CT scan in 5/13  withand without contrast that revealed no abnormality.  Adenocarcinoma of the prostate: He underwent a TURP in 8/08 which revealed a minute focus of Gleason 6 adenocarcinoma. A PSA done in 6/12 was 0.5.  Clinical stage: T1a  Interval history: He has had no further gross hematuria.     Past Medical History Problems  1. History of  Arthritis V13.4 2. History of  Chronic Liver Disease 571.9 3. History of  Esophageal Reflux 530.81 4. History of  Hepatitis, C Virus 070.70 5. History of  Hypertension 401.9  Surgical History Problems  1. History of  Hand Surgery 2. History of  Hernia Repair Right 3. History of  Jaw Surgery 4. History of  Transurethral Resection Of Prostate (TURP)  Current Meds 1. Atenolol 100 MG Oral Tablet; Therapy: (Recorded:25Jun2013) to 2. NexIUM 40 MG Oral Capsule Delayed Release; Therapy: (Recorded:25Jun2013) to 3. Oxycodone-Acetaminophen 7.5-500 MG Oral Tablet; Therapy: (Recorded:25Jun2013) to  Allergies Medication  1. No Known Drug Allergies  Family History Problems  1. Family history of  Acute Myocardial Infarction V17.3 2. Family history of  Heart Disease V17.49 3. Family history of  Stroke Syndrome V17.1  Social History Problems  1. Caffeine Use 2. Marital History - Divorced V61.03 3. Tobacco Use 305.1 1ppdx20 years Denied  4. History of  Alcohol Use  Review of Systems Genitourinary, constitutional, skin, eye, otolaryngeal, hematologic/lymphatic, cardiovascular, pulmonary, endocrine, musculoskeletal, gastrointestinal, neurological and psychiatric system(s) were reviewed and pertinent findings if present are noted.  Genitourinary: urinary frequency, nocturia and hematuria.  Gastrointestinal:  heartburn.  Constitutional: recent weight loss.  Musculoskeletal: back pain and joint pain.  Neurological: headache.  Psychiatric: depression.    Vitals Vital Signs Blood Pressure: 163 / 84 Heart Rate: 50  BMI Calculated: 21.48 BSA Calculated: 1.8 Height: 5 ft 9 in Weight: 145 lb   Physical Exam Constitutional: Well nourished and well developed . No acute distress.  ENT:. The ears and nose are normal in appearance.  Neck: The appearance of the neck is normal and no neck mass is present.  Pulmonary: No respiratory distress and normal respiratory rhythm and effort.  Cardiovascular: Heart rate and rhythm are normal . No peripheral edema.  Abdomen: right inguinal incision site(s) well healed. The abdomen is soft and nontender. No masses are palpated. No CVA tenderness. No hernias are palpable. No hepatosplenomegaly noted.  Rectal: Rectal exam demonstrates normal sphincter tone, no tenderness and no masses. The prostate has a palpable nodule involving the right, base of the prostate and is not tender. The left seminal vesicle is nonpalpable. The right seminal vesicle is nonpalpable. The perineum is normal on inspection.  Genitourinary: Examination of the penis demonstrates no discharge, no masses, no lesions and a normal meatus. The scrotum is without lesions. The right epididymis is palpably normal and non-tender. The left epididymis is palpably normal and non-tender. The right testis is non-tender and without masses. The left testis is non-tender and without masses.  Lymphatics: The femoral and inguinal nodes are not enlarged or tender.  Skin: Normal skin turgor, no visible rash and no visible skin lesions.  Neuro/Psych:. Mood and affect are appropriate.   Assessment Assessed  1. Gross Hematuria 599.71 2. Adenocarcinoma Of  The Prostate Gland 185 3. 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 His CT scan revealed no abnormality of the upper tract. There was a TUR defect noted consistent with his prior surgery. Cystoscopically he was found to have a papillary tumor measuring about 5-6 mm located on the posterior left wall laterally and well away from the ureteral orifice. I therefore have discussed the need for transurethral resectional biopsy of the lesion. I went over the procedure with him in detail including its risks and complications as well as alternatives and anticipated probability of success. He understands and has elected to proceed.   Plan    He is scheduled for transurethral resection of his bladder tumor.

## 2011-12-03 ENCOUNTER — Encounter (HOSPITAL_BASED_OUTPATIENT_CLINIC_OR_DEPARTMENT_OTHER): Payer: Self-pay | Admitting: Anesthesiology

## 2011-12-03 ENCOUNTER — Encounter (HOSPITAL_BASED_OUTPATIENT_CLINIC_OR_DEPARTMENT_OTHER)
Admission: RE | Disposition: A | Discharge status: Home / Self Care | Payer: Self-pay | Source: Ambulatory Visit | Attending: Urology

## 2011-12-03 ENCOUNTER — Ambulatory Visit (HOSPITAL_BASED_OUTPATIENT_CLINIC_OR_DEPARTMENT_OTHER): Payer: Medicaid Other | Admitting: Anesthesiology

## 2011-12-03 ENCOUNTER — Ambulatory Visit (HOSPITAL_BASED_OUTPATIENT_CLINIC_OR_DEPARTMENT_OTHER)
Admission: RE | Admit: 2011-12-03 | Discharge: 2011-12-03 | Disposition: A | Discharge status: Home / Self Care | Payer: Medicaid Other | Source: Ambulatory Visit | Attending: Urology | Admitting: Urology

## 2011-12-03 ENCOUNTER — Encounter (HOSPITAL_BASED_OUTPATIENT_CLINIC_OR_DEPARTMENT_OTHER): Payer: Self-pay | Admitting: *Deleted

## 2011-12-03 DIAGNOSIS — K219 Gastro-esophageal reflux disease without esophagitis: Secondary | ICD-10-CM | POA: Insufficient documentation

## 2011-12-03 DIAGNOSIS — I10 Essential (primary) hypertension: Secondary | ICD-10-CM | POA: Insufficient documentation

## 2011-12-03 DIAGNOSIS — Z79899 Other long term (current) drug therapy: Secondary | ICD-10-CM | POA: Insufficient documentation

## 2011-12-03 DIAGNOSIS — C61 Malignant neoplasm of prostate: Secondary | ICD-10-CM | POA: Insufficient documentation

## 2011-12-03 DIAGNOSIS — D494 Neoplasm of unspecified behavior of bladder: Secondary | ICD-10-CM | POA: Diagnosis present

## 2011-12-03 DIAGNOSIS — C679 Malignant neoplasm of bladder, unspecified: Secondary | ICD-10-CM | POA: Insufficient documentation

## 2011-12-03 DIAGNOSIS — K769 Liver disease, unspecified: Secondary | ICD-10-CM | POA: Insufficient documentation

## 2011-12-03 HISTORY — DX: Neoplasm of unspecified behavior of bladder: D49.4

## 2011-12-03 HISTORY — PX: CYSTOSCOPY WITH BIOPSY: SHX5122

## 2011-12-03 HISTORY — DX: Hematuria, unspecified: R31.9

## 2011-12-03 HISTORY — DX: Personal history of other medical treatment: Z92.89

## 2011-12-03 HISTORY — DX: Cerebral infarction, unspecified: I63.9

## 2011-12-03 LAB — POCT HEMOGLOBIN-HEMACUE: Hemoglobin: 15.6 g/dL (ref 13.0–17.0)

## 2011-12-03 SURGERY — CYSTOSCOPY, WITH BIOPSY
Anesthesia: General | Site: Bladder | Wound class: Clean Contaminated

## 2011-12-03 MED ORDER — STERILE WATER FOR IRRIGATION IR SOLN
Status: DC | PRN
  Administered 2011-12-03: 1 via INTRAVESICAL

## 2011-12-03 MED ORDER — CIPROFLOXACIN IN D5W 200 MG/100ML IV SOLN
200.0000 mg | INTRAVENOUS | Status: AC
  Administered 2011-12-03: 200 mg via INTRAVENOUS

## 2011-12-03 MED ORDER — LIDOCAINE HCL (CARDIAC) 20 MG/ML IV SOLN
INTRAVENOUS | Status: DC | PRN
  Administered 2011-12-03: 60 mg via INTRAVENOUS

## 2011-12-03 MED ORDER — PHENAZOPYRIDINE HCL 200 MG PO TABS: 200.0000 mg | ORAL_TABLET | Freq: Three times a day (TID) | ORAL | Status: AC | PRN

## 2011-12-03 MED ORDER — DEXAMETHASONE SODIUM PHOSPHATE 4 MG/ML IJ SOLN
INTRAMUSCULAR | Status: DC | PRN
  Administered 2011-12-03: 4 mg via INTRAVENOUS

## 2011-12-03 MED ORDER — FENTANYL CITRATE 0.05 MG/ML IJ SOLN
INTRAMUSCULAR | Status: DC | PRN
  Administered 2011-12-03: 100 ug via INTRAVENOUS

## 2011-12-03 MED ORDER — FENTANYL CITRATE 0.05 MG/ML IJ SOLN: 25.0000 ug | INTRAMUSCULAR | Status: DC | PRN

## 2011-12-03 MED ORDER — PHENAZOPYRIDINE HCL 200 MG PO TABS
200.0000 mg | ORAL_TABLET | Freq: Once | ORAL | Status: AC
  Administered 2011-12-03: 200 mg via ORAL

## 2011-12-03 MED ORDER — PROPOFOL 10 MG/ML IV EMUL
INTRAVENOUS | Status: DC | PRN
  Administered 2011-12-03: 180 mg via INTRAVENOUS

## 2011-12-03 MED ORDER — ATROPINE SULFATE 0.4 MG/ML IJ SOLN
INTRAMUSCULAR | Status: DC | PRN
  Administered 2011-12-03: 0.4 mg via INTRAVENOUS

## 2011-12-03 MED ORDER — EPHEDRINE SULFATE 50 MG/ML IJ SOLN
INTRAMUSCULAR | Status: DC | PRN
  Administered 2011-12-03: 10 mg via INTRAVENOUS

## 2011-12-03 MED ORDER — HYDROCODONE-ACETAMINOPHEN 10-325 MG PO TABS: 1.0000 | ORAL_TABLET | ORAL | Status: AC | PRN

## 2011-12-03 MED ORDER — LACTATED RINGERS IV SOLN
INTRAVENOUS | Status: DC
  Administered 2011-12-03 (×2): via INTRAVENOUS

## 2011-12-03 SURGICAL SUPPLY — 32 items
BAG DRAIN URO-CYSTO SKYTR STRL (DRAIN) ×3 IMPLANT
BAG URINE DRAINAGE (UROLOGICAL SUPPLIES) IMPLANT
BAG URINE LEG 19OZ MD ST LTX (BAG) IMPLANT
CANISTER SUCT LVC 12 LTR MEDI- (MISCELLANEOUS) ×6 IMPLANT
CATH FOLEY 2WAY SLVR  5CC 20FR (CATHETERS)
CATH FOLEY 2WAY SLVR  5CC 22FR (CATHETERS)
CATH FOLEY 2WAY SLVR  5CC 24FR (CATHETERS) ×1
CATH FOLEY 2WAY SLVR 5CC 20FR (CATHETERS) IMPLANT
CATH FOLEY 2WAY SLVR 5CC 22FR (CATHETERS) IMPLANT
CATH FOLEY 2WAY SLVR 5CC 24FR (CATHETERS) ×2 IMPLANT
CLOTH BEACON ORANGE TIMEOUT ST (SAFETY) ×3 IMPLANT
DRAPE CAMERA CLOSED 9X96 (DRAPES) ×3 IMPLANT
ELECT BUTTON BIOP 24F 90D PLAS (MISCELLANEOUS) IMPLANT
ELECT LOOP HF 26F 30D .35MM (CUTTING LOOP) IMPLANT
ELECT LOOP MED HF 24F 12D CBL (CLIP) ×3 IMPLANT
ELECT REM PT RETURN 9FT ADLT (ELECTROSURGICAL) ×3
ELECT RESECT VAPORIZE 12D CBL (ELECTRODE) ×3 IMPLANT
ELECTRODE REM PT RTRN 9FT ADLT (ELECTROSURGICAL) ×2 IMPLANT
EVACUATOR MICROVAS BLADDER (UROLOGICAL SUPPLIES) IMPLANT
GLOVE BIO SURGEON STRL SZ8 (GLOVE) ×3 IMPLANT
GLOVE BIOGEL M 6.5 STRL (GLOVE) ×3 IMPLANT
GLOVE ECLIPSE 6.0 STRL STRAW (GLOVE) ×3 IMPLANT
GOWN PREVENTION PLUS LG XLONG (DISPOSABLE) ×3 IMPLANT
GOWN STRL REIN XL XLG (GOWN DISPOSABLE) ×3 IMPLANT
HOLDER FOLEY CATH W/STRAP (MISCELLANEOUS) IMPLANT
IV NS IRRIG 3000ML ARTHROMATIC (IV SOLUTION) IMPLANT
KIT ASPIRATION TUBING (SET/KITS/TRAYS/PACK) ×3 IMPLANT
LOOP CUTTING 24FR OLYMPUS (CUTTING LOOP) IMPLANT
PACK CYSTOSCOPY (CUSTOM PROCEDURE TRAY) ×3 IMPLANT
PLUG CATH AND CAP STER (CATHETERS) IMPLANT
SET ASPIRATION TUBING (TUBING) IMPLANT
WATER STERILE IRR 3000ML UROMA (IV SOLUTION) IMPLANT

## 2011-12-03 NOTE — Transfer of Care (Signed)
Immediate Anesthesia Transfer of Care Note  Patient: Mathew Grant  Procedure(s) Performed: Procedure(s) (LRB): CYSTOSCOPY WITH BIOPSY (N/A)  Patient Location: PACU  Anesthesia Type: General  Level of Consciousness: awake, oriented, sedated and patient cooperative  Airway & Oxygen Therapy: Patient Spontanous Breathing and Patient connected to face mask oxygen  Post-op Assessment: Report given to PACU RN and Post -op Vital signs reviewed and stable  Post vital signs: Reviewed and stable  Complications: No apparent anesthesia complications

## 2011-12-03 NOTE — Op Note (Signed)
PATIENT:  Julious Payer  PRE-OPERATIVE DIAGNOSIS:  Bladder tumor  POST-OPERATIVE DIAGNOSIS:  Same  PROCEDURE:  Procedure(s): TUR-BT(0.6 mm lesion)  SURGEON:  Garnett Farm  INDICATION: Mr. Zoll is a 61 year old male who developed gross hematuria. A CT scan of the upper tract revealed no abnormality. He was found cystoscopically to have a small bladder tumor on the left wall of his bladder. We discussed treatment options and elected to proceed with surgical removal.  ANESTHESIA:  General  EBL:  Minimal  SPECIMEN:  Bladder tumor  DISPOSITION OF SPECIMEN:  PATHOLOGY  Description of procedure: After informed consent the patient was taken to the major or, placed on the table and administered general anesthesia. He was then moved to the dorsal lithotomy position and his genitalia was sterilely prepped and draped. An official timeout was then performed.  The 22 French cystoscope with 12 lens was then passed under direct vision down the urethra which is noted be normal. The prostatic urethra was widely patent with signs of previous resection and a wide bladder neck. Ureteral orifices were noted to be of normal configuration and position. The bladder was fully and systematically inspected and a single bladder tumor was identified on the left wall. It appeared small enough to be managed with a cold cup biopsy. I therefore introduced the cold cup biopsy forceps into the bladder and was able to obtain a specimen that included all of the papillary tumor identified. I then inserted the Bugbee electrode and fulgurated the base where the tumor had been located and then circumferentially around the biopsy site I fulgurated the mucosa as well. No bleeding was noted at the end of the operation. I therefore removed the cystoscope and the patient was awakened and taken to recovery room in stable and satisfactory condition. Tolerated the procedure well no intraoperative complications.  PLAN OF CARE: Discharge  to home after PACU  PATIENT DISPOSITION:  PACU - hemodynamically stable.

## 2011-12-03 NOTE — Anesthesia Procedure Notes (Signed)
Procedure Name: LMA Insertion Date/Time: 12/03/2011 11:24 AM Performed by: Renella Cunas D Pre-anesthesia Checklist: Patient identified, Emergency Drugs available, Suction available and Patient being monitored Patient Re-evaluated:Patient Re-evaluated prior to inductionOxygen Delivery Method: Circle System Utilized Preoxygenation: Pre-oxygenation with 100% oxygen Intubation Type: IV induction Ventilation: Mask ventilation without difficulty LMA: LMA inserted LMA Size: 4.0 Number of attempts: 1 Airway Equipment and Method: bite block Placement Confirmation: positive ETCO2 Tube secured with: Tape Dental Injury: Teeth and Oropharynx as per pre-operative assessment

## 2011-12-03 NOTE — Anesthesia Postprocedure Evaluation (Signed)
  Anesthesia Post-op Note  Patient: Mathew Grant  Procedure(s) Performed: Procedure(s) (LRB): CYSTOSCOPY WITH BIOPSY (N/A)  Patient Location: PACU  Anesthesia Type: General  Level of Consciousness: oriented and sedated  Airway and Oxygen Therapy: Patient Spontanous Breathing  Post-op Pain: mild  Post-op Assessment: Post-op Vital signs reviewed, Patient's Cardiovascular Status Stable, Respiratory Function Stable and Patent Airway  Post-op Vital Signs: stable  Complications: No apparent anesthesia complications

## 2011-12-03 NOTE — Interval H&P Note (Signed)
History and Physical Interval Note:  12/03/2011 10:45 AM  Mathew Grant  has presented today for surgery, with the diagnosis of BLADDER TUMOR  The various methods of treatment have been discussed with the patient and family. After consideration of risks, benefits and other options for treatment, the patient has consented to  Procedure(s) (LRB): TRANSURETHRAL RESECTION OF BLADDER TUMOR (TURBT) (N/A) as a surgical intervention .  The patient's history has been reviewed, patient examined, no change in status, stable for surgery.  I have reviewed the patient's chart and labs.  Questions were answered to the patient's satisfaction.     Garnett Farm

## 2011-12-03 NOTE — Anesthesia Preprocedure Evaluation (Signed)
Anesthesia Evaluation  Patient identified by MRN, date of birth, ID band Patient awake  General Assessment Comment:Infection precautions  Reviewed: Allergy & Precautions, H&P , NPO status , Patient's Chart, lab work & pertinent test results, reviewed documented beta blocker date and time   Airway Mallampati: II TM Distance: >3 FB     Dental  (+) Poor Dentition and Teeth Intact   Pulmonary Current Smoker,  breath sounds clear to auscultation        Cardiovascular hypertension, Pt. on medications Rhythm:Regular Rate:Normal  Denies cardiac symptoms   Neuro/Psych RUE weakness CVA, Residual Symptoms negative psych ROS   GI/Hepatic negative GI ROS, GERD-  Medicated,(+) Hepatitis -, C  Endo/Other  negative endocrine ROS  Renal/GU negative Renal ROS   Bladder lesion Hx prostate cancer negative genitourinary   Musculoskeletal negative musculoskeletal ROS (+)   Abdominal   Peds negative pediatric ROS (+)  Hematology negative hematology ROS (+)   Anesthesia Other Findings   Reproductive/Obstetrics negative OB ROS                           Anesthesia Physical Anesthesia Plan  ASA: III  Anesthesia Plan: General   Post-op Pain Management:    Induction: Intravenous  Airway Management Planned: LMA  Additional Equipment:   Intra-op Plan:   Post-operative Plan: Extubation in OR  Informed Consent: I have reviewed the patients History and Physical, chart, labs and discussed the procedure including the risks, benefits and alternatives for the proposed anesthesia with the patient or authorized representative who has indicated his/her understanding and acceptance.   Dental advisory given  Plan Discussed with: CRNA and Surgeon  Anesthesia Plan Comments:         Anesthesia Quick Evaluation

## 2011-12-04 ENCOUNTER — Encounter (HOSPITAL_BASED_OUTPATIENT_CLINIC_OR_DEPARTMENT_OTHER): Payer: Self-pay | Admitting: Urology

## 2011-12-24 ENCOUNTER — Other Ambulatory Visit: Payer: Self-pay | Admitting: Gastroenterology

## 2011-12-24 DIAGNOSIS — B1920 Unspecified viral hepatitis C without hepatic coma: Secondary | ICD-10-CM

## 2011-12-26 ENCOUNTER — Other Ambulatory Visit: Payer: Self-pay

## 2011-12-26 DIAGNOSIS — B1920 Unspecified viral hepatitis C without hepatic coma: Secondary | ICD-10-CM

## 2012-01-02 ENCOUNTER — Telehealth: Payer: Self-pay | Admitting: Gastroenterology

## 2012-01-02 NOTE — Telephone Encounter (Signed)
Patient overdue for f/u of liver issues. Please offer appointment.

## 2012-01-09 ENCOUNTER — Encounter: Payer: Self-pay | Admitting: Internal Medicine

## 2012-01-09 NOTE — Telephone Encounter (Signed)
Mailed letter and appt card for OV on 9/16 @10  with [facility]

## 2012-01-15 NOTE — Progress Notes (Signed)
Quick Note:  AFP great! Has upcoming OV. Will arrange for abd u/s for hepatoma surveillance at that time. ______

## 2012-01-17 NOTE — Progress Notes (Signed)
Quick Note:  Mailed letter to pt ______ 

## 2012-01-25 ENCOUNTER — Encounter: Payer: Self-pay | Admitting: Internal Medicine

## 2012-01-28 ENCOUNTER — Ambulatory Visit (INDEPENDENT_AMBULATORY_CARE_PROVIDER_SITE_OTHER): Payer: Medicaid Other | Admitting: Gastroenterology

## 2012-01-28 ENCOUNTER — Encounter: Payer: Self-pay | Admitting: Gastroenterology

## 2012-01-28 VITALS — BP 132/76 | HR 55 | Temp 97.0°F | Ht 68.0 in | Wt 147.2 lb

## 2012-01-28 DIAGNOSIS — R1013 Epigastric pain: Secondary | ICD-10-CM

## 2012-01-28 DIAGNOSIS — B1920 Unspecified viral hepatitis C without hepatic coma: Secondary | ICD-10-CM

## 2012-01-28 MED ORDER — ESOMEPRAZOLE MAGNESIUM 40 MG PO CPDR: 40.0000 mg | DELAYED_RELEASE_CAPSULE | Freq: Every day | ORAL | Status: DC

## 2012-01-28 NOTE — Patient Instructions (Addendum)
Please have your abdominal ultrasound done as scheduled. Please restart Nexium 40 mg daily before breakfast. New prescription sent to [facility]. If your abdominal pain does not settle down or your appetite does not improve, please let us know. Office visit in 6 months with Dr. Jena Gauss.

## 2012-01-28 NOTE — Progress Notes (Signed)
Primary Care Physician: Louie Boston, MD  Primary Gastroenterologist:  Roetta Sessions, MD   Chief Complaint  Patient presents with  . Follow-up  . Abdominal Pain    HPI: Mathew Grant is a 61 y.o. male here for f/u of chronic HCV, h/o elevated AFP, and abdominal pain. H/O weight loss but this has been stable since 04/2011. H/O HCV but previously has declined treatment.    Dr. Vernie Ammons recent did surgery for bladder tumor. Cytoscopy scheduled in couple of months. Overall he feels well from surgery without any urological issues. Feels tired since surgery.  Tapered down on Nexium due to no refills. Not taking very much lately. No burning. Occasional epig pain. Less appetite after bladder surgery. Avoiding spicy/fried foods. No dysphagia. No etoh for seven years. BM good. No melena, brbpr. Not interested in HCV treatment.   Current Outpatient Prescriptions  Medication Sig Dispense Refill  . atenolol (TENORMIN) 50 MG tablet Take 100 mg by mouth at bedtime.       Marland Kitchen esomeprazole (NEXIUM) 40 MG capsule Take 1 capsule (40 mg total) by mouth daily before breakfast.  30 capsule  11  . oxyCODONE-acetaminophen (PERCOCET) 7.5-325 MG per tablet Take 1 tablet by mouth every 8 (eight) hours as needed.       Marland Kitchen DISCONTD: esomeprazole (NEXIUM) 40 MG capsule Take 1 capsule (40 mg total) by mouth daily before breakfast.  30 capsule  5    Allergies as of 01/28/2012  . (No Known Allergies)    ROS:  General: Negative for  weight loss, fever, chills,  Weakness. Some fatigue and decreased appetite. ENT: Negative for hoarseness, difficulty swallowing , nasal congestion. CV: Negative for chest pain, angina, palpitations, dyspnea on exertion, peripheral edema.  Respiratory: Negative for dyspnea at rest, dyspnea on exertion, cough, sputum, wheezing.  GI: See history of present illness. GU:  Negative for dysuria, hematuria, urinary incontinence, urinary frequency, nocturnal urination.  Endo: Negative for  unusual weight change.    Physical Examination:   BP 132/76  Pulse 55  Temp 97 F (36.1 C) (Temporal)  Ht 5\' 8"  (1.727 m)  Wt 147 lb 3.2 oz (66.769 kg)  BMI 22.38 kg/m2  General: Well-nourished, well-developed in no acute distress.  Eyes: No icterus. Mouth: Oropharyngeal mucosa moist and pink , no lesions erythema or exudate. Lungs: Clear to auscultation bilaterally.  Heart: Regular rate and rhythm, no murmurs rubs or gallops.  Abdomen: Bowel sounds are normal, nontender, nondistended, no hepatosplenomegaly or masses, no abdominal bruits or hernia , no rebound or guarding.   Extremities: No lower extremity edema. No clubbing or deformities. Neuro: Alert and oriented x 4   Skin: Warm and dry, no jaundice.   Psych: Alert and cooperative, normal mood and affect.  Labs:  Component     Latest Ref Rng 09/08/2009 11/01/2010 12/26/2011  AFP-Tumor Marker     0.0 - 8.0 ng/mL 2.2 11.9 <1.3   Lab Results  Component Value Date   ALT 18 11/30/2011   AST 19 11/30/2011   ALKPHOS 46 11/30/2011   BILITOT 0.6 11/30/2011   Lab Results  Component Value Date          HGB 15.6 12/03/2011            Imaging Studies: No results found.

## 2012-01-28 NOTE — Progress Notes (Signed)
Faxed to PCP

## 2012-01-28 NOTE — Assessment & Plan Note (Signed)
Was doing better until tapered down on Nexium. Extensive work-up in past. See 04/2011 note for details. I have asked him to restart his Nexium daily. New RX and samples provided. If appetite and epigastric discomfort do not improve, he will let me know. Otherwise, he will see Dr. Jena Gauss in six months for f/u.

## 2012-01-28 NOTE — Assessment & Plan Note (Signed)
Chronic HCV, genotype 1a. Recent AFP much better. Due for abd u/s for hepatoma surveillance. Patient still not interested in HCV treatment at this time.

## 2012-01-31 ENCOUNTER — Ambulatory Visit (HOSPITAL_COMMUNITY)
Admission: RE | Admit: 2012-01-31 | Discharge: 2012-01-31 | Disposition: A | Discharge status: Home / Self Care | Payer: Medicaid Other | Source: Ambulatory Visit | Attending: Gastroenterology | Admitting: Gastroenterology

## 2012-01-31 DIAGNOSIS — B1920 Unspecified viral hepatitis C without hepatic coma: Secondary | ICD-10-CM | POA: Insufficient documentation

## 2012-01-31 DIAGNOSIS — Z8551 Personal history of malignant neoplasm of bladder: Secondary | ICD-10-CM | POA: Insufficient documentation

## 2012-01-31 DIAGNOSIS — Z8546 Personal history of malignant neoplasm of prostate: Secondary | ICD-10-CM | POA: Insufficient documentation

## 2012-02-01 ENCOUNTER — Other Ambulatory Visit: Payer: Self-pay | Admitting: Gastroenterology

## 2012-02-01 DIAGNOSIS — B1920 Unspecified viral hepatitis C without hepatic coma: Secondary | ICD-10-CM

## 2012-02-01 NOTE — Progress Notes (Signed)
Quick Note:  Please let patient know his ultrasound looked good. No evidence of cirrhosis or hepatoma.  He will need LFTs, AFP, abd u/s in 07/2011 prior to his OV with [facility] only. Dx: chronic HCV, hepatoma surveillance. ______

## 2012-06-30 ENCOUNTER — Other Ambulatory Visit: Payer: Self-pay

## 2012-06-30 DIAGNOSIS — B1920 Unspecified viral hepatitis C without hepatic coma: Secondary | ICD-10-CM

## 2012-07-11 ENCOUNTER — Encounter: Payer: Self-pay | Admitting: *Deleted

## 2012-11-28 ENCOUNTER — Telehealth: Payer: Self-pay | Admitting: Gastroenterology

## 2012-11-28 NOTE — Telephone Encounter (Signed)
Patient is due for f/u labs and u/s. Please schedule OV.

## 2012-12-02 ENCOUNTER — Encounter: Payer: Self-pay | Admitting: Internal Medicine

## 2012-12-02 NOTE — Telephone Encounter (Signed)
Pt is aware of OV on 8/25 at 0930 with [facility] and appt card was mailed

## 2013-01-05 ENCOUNTER — Other Ambulatory Visit: Payer: Self-pay | Admitting: Gastroenterology

## 2013-01-05 ENCOUNTER — Ambulatory Visit (INDEPENDENT_AMBULATORY_CARE_PROVIDER_SITE_OTHER): Payer: Medicaid Other | Admitting: Gastroenterology

## 2013-01-05 ENCOUNTER — Encounter: Payer: Self-pay | Admitting: Gastroenterology

## 2013-01-05 VITALS — BP 149/70 | HR 54 | Temp 97.2°F | Ht 64.0 in | Wt 145.4 lb

## 2013-01-05 DIAGNOSIS — B1920 Unspecified viral hepatitis C without hepatic coma: Secondary | ICD-10-CM

## 2013-01-05 DIAGNOSIS — R1013 Epigastric pain: Secondary | ICD-10-CM

## 2013-01-05 DIAGNOSIS — R1011 Right upper quadrant pain: Secondary | ICD-10-CM

## 2013-01-05 LAB — HEPATIC FUNCTION PANEL
ALT: 19 U/L (ref 0–53)
Albumin: 4.4 g/dL (ref 3.5–5.2)
Bilirubin, Direct: 0.1 mg/dL (ref 0.0–0.3)
Total Bilirubin: 0.5 mg/dL (ref 0.3–1.2)

## 2013-01-05 MED ORDER — ESOMEPRAZOLE MAGNESIUM 40 MG PO CPDR: 40.0000 mg | DELAYED_RELEASE_CAPSULE | Freq: Every day | ORAL | Status: DC

## 2013-01-05 NOTE — Assessment & Plan Note (Signed)
Generally does better on Nexium. Also helps to lie prone after meals. No evidence of SMA syndrome on prior CT. Resume Nexium. Samples provided. We will work on prior authorization if necessary. Office visit with Dr. Jena Gauss in 6 months or sooner if he has worsening symptoms.

## 2013-01-05 NOTE — Patient Instructions (Addendum)
1.  Restart Nexium 40 mg daily. Samples provided. We will complete paperwork for Medicaid. 2. Please have your blood work and ultrasound done. 3. Followup office visit with Dr. Jena Gauss in 6 months.

## 2013-01-05 NOTE — Progress Notes (Signed)
Primary Care Physician: Louie Boston, MD  Primary Gastroenterologist:  Roetta Sessions, MD   Chief Complaint  Patient presents with  . Follow-up    HPI: Mathew Grant is a 62 y.o. male here for followup of chronic HCV, history of elevated AFP, abdominal pain. Previously declined treatment for chronic HCV. He is overdue for labs and abdominal ultrasound. Previous abnormal AFP of 11.9 (2 years ago) but normal one year ago. No further evaluation planned.  Having trouble getting Nexium filled by Medicaid. Previously failed Prevacid and pantoprazole. He cannot remember about Prilosec for sure. These PPIs increased his abdominal pain and did not help. PP, lays on stomach and this helps. No n/v. BM daily. No melena, brbpr. Chronic right upper quadrant pain with touch for more than 10 years. Last EGD August 2012, chronic gastritis but no H. pylori. Last colonoscopy in 2012, due for colonoscopy June 2017 for history of advanced adenomas.  Patient still does not want to consider hepatitis C. treatment.  Current Outpatient Prescriptions  Medication Sig Dispense Refill  . atenolol (TENORMIN) 50 MG tablet Take 100 mg by mouth at bedtime.       Marland Kitchen oxyCODONE-acetaminophen (PERCOCET) 7.5-325 MG per tablet Take 1 tablet by mouth every 8 (eight) hours as needed.        No current facility-administered medications for this visit.    Allergies as of 01/05/2013  . (No Known Allergies)    ROS:  General: Negative for anorexia, weight loss, fever, chills, fatigue, weakness. ENT: Negative for hoarseness, difficulty swallowing , nasal congestion. CV: Negative for chest pain, angina, palpitations, dyspnea on exertion, peripheral edema.  Respiratory: Negative for dyspnea at rest, dyspnea on exertion, cough, sputum, wheezing.  GI: See history of present illness. GU:  Negative for dysuria, hematuria, urinary incontinence, urinary frequency, nocturnal urination.  Endo: Negative for unusual weight change.     Physical Examination:   BP 149/70  Pulse 54  Temp(Src) 97.2 F (36.2 C) (Oral)  Ht 5\' 4"  (1.626 m)  Wt 145 lb 6.4 oz (65.953 kg)  BMI 24.95 kg/m2  General: Well-nourished, well-developed in no acute distress.  Eyes: No icterus. Mouth: Oropharyngeal mucosa moist and pink , no lesions erythema or exudate. Lungs: Clear to auscultation bilaterally.  Heart: Regular rate and rhythm, no murmurs rubs or gallops.  Abdomen: Bowel sounds are normal, nontender, nondistended, no hepatosplenomegaly or masses, no abdominal bruits or hernia , no rebound or guarding.   Extremities: No lower extremity edema. No clubbing or deformities. Neuro: Alert and oriented x 4   Skin: Warm and dry, no jaundice.   Psych: Alert and cooperative, normal mood and affect.

## 2013-01-05 NOTE — Assessment & Plan Note (Signed)
Chronic, greater than 10 years duration. Extensive evaluation as noted previously. Abdominal ultrasound planned for hepatoma surveillance, only.

## 2013-01-05 NOTE — Assessment & Plan Note (Signed)
Chronic HCV, genotype 1A. Patient not interested in treatment. Update LFTs and abdominal ultrasound.

## 2013-01-05 NOTE — Progress Notes (Signed)
CC'd to PCP 

## 2013-01-06 NOTE — Progress Notes (Signed)
Quick Note:  LFTs normal. Await u/s. ______

## 2013-01-07 ENCOUNTER — Ambulatory Visit (HOSPITAL_COMMUNITY)
Admission: RE | Admit: 2013-01-07 | Discharge: 2013-01-07 | Disposition: A | Discharge status: Home / Self Care | Payer: Medicaid Other | Source: Ambulatory Visit | Attending: Gastroenterology | Admitting: Gastroenterology

## 2013-01-07 DIAGNOSIS — R1011 Right upper quadrant pain: Secondary | ICD-10-CM

## 2013-01-07 DIAGNOSIS — K759 Inflammatory liver disease, unspecified: Secondary | ICD-10-CM | POA: Insufficient documentation

## 2013-01-07 DIAGNOSIS — B1920 Unspecified viral hepatitis C without hepatic coma: Secondary | ICD-10-CM

## 2013-01-15 NOTE — Progress Notes (Signed)
Quick Note:  U/S normal. OV in 6 months with RMR as planned. ______

## 2013-05-03 IMAGING — CT CT ABDOMEN WO/W CM
3 of 9 series · 11 of 46 positions shown, 17 images · IV contrast (Omnipaque 300)
Comparison: None.

CLINICAL DATA: Upper abdominal pain for 1 year, history of prostate
cancer

CT ABDOMEN WITHOUT AND WITH CONTRAST
TECHNIQUE: Multidetector CT imaging of the abdomen was performed
following the standard protocol before and during bolus
administration of intravenous contrast. Sagittal and coronal MPR
images reconstructed from axial data set.
Contrast: Dilute oral contrast. 100 ml Omnipaque 300 IV.

[Series 3: arterial 25 sec 3.0 b40f · axial · arterial · 0.74mm/px · z∈[-274,-202]mm · 3 of 74 slices shown]
[im 13/74  soft-tissue]
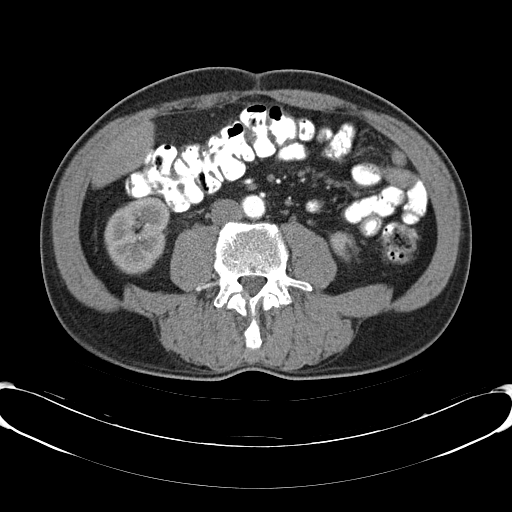
[im 25/74  soft-tissue]
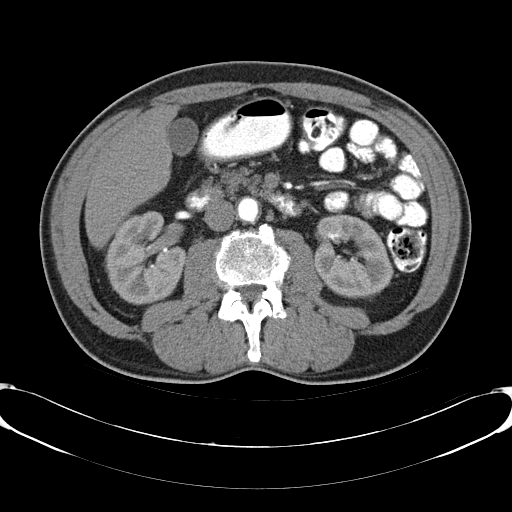
[im 37/74  soft-tissue]
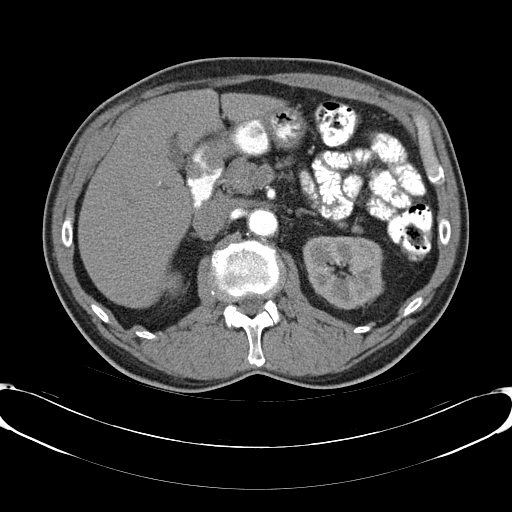

[Series 4: mpr arterial cor (id) · coronal · arterial · 0.44mm/px · 3 of 88 slices shown, 4 images]
[im 22/88  soft-tissue]
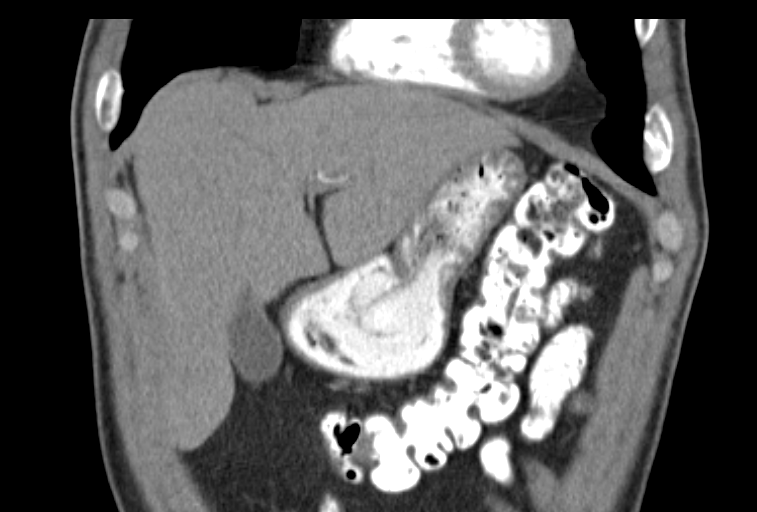
[im 44/88  soft-tissue]
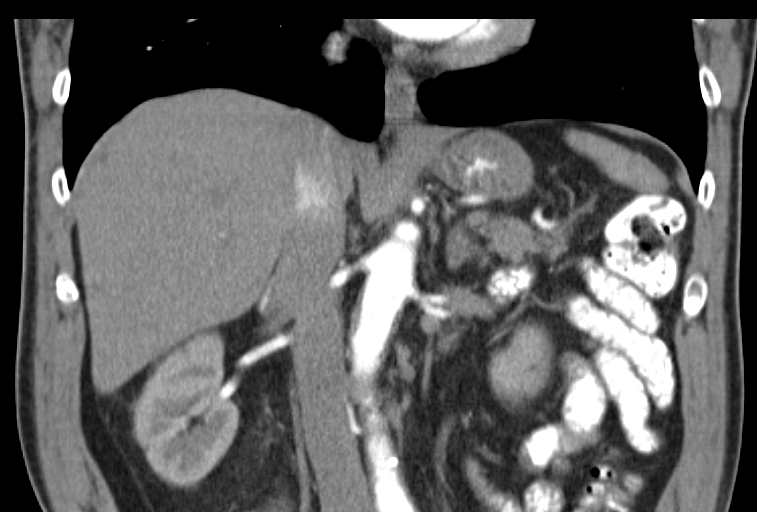
[im 44/88  bone]
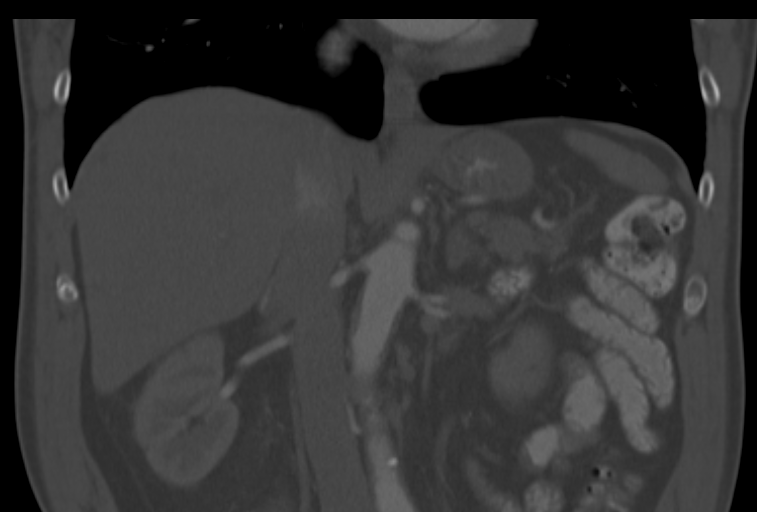
[im 66/88  soft-tissue]
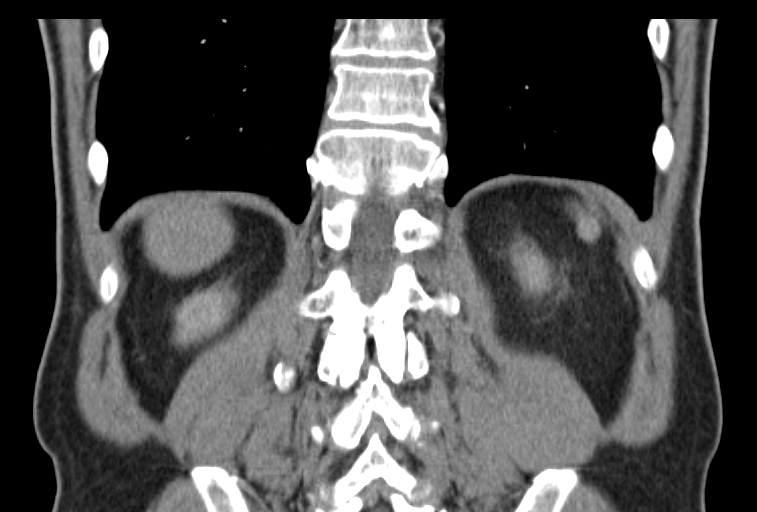

[Series 7: venous 60 sec 3.0 b40f · axial · portal-venous · 0.74mm/px · z∈[-272,-128]mm · 5 of 74 slices shown, 10 images]
[im 13/74  soft-tissue]
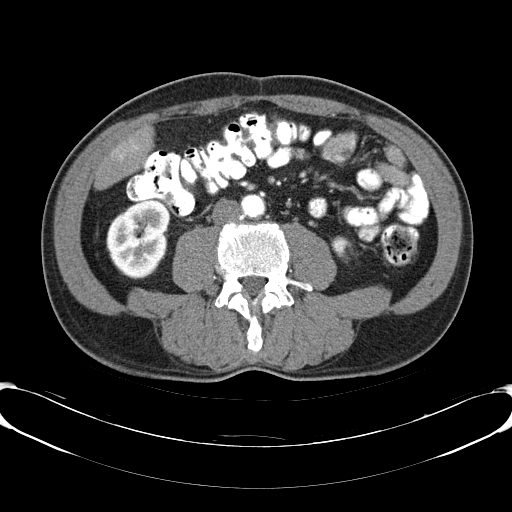
[im 13/74  bone]
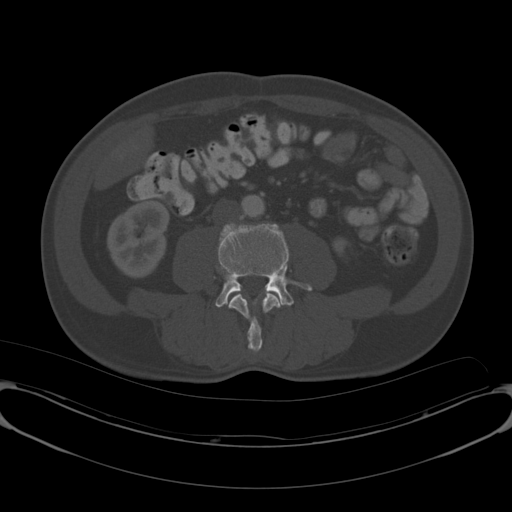
[im 25/74  soft-tissue]
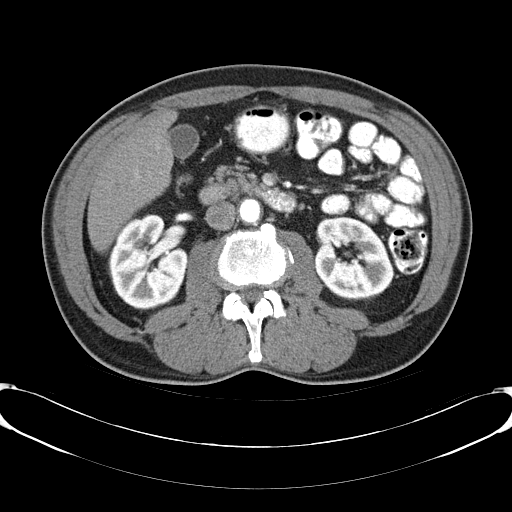
[im 25/74  lung]
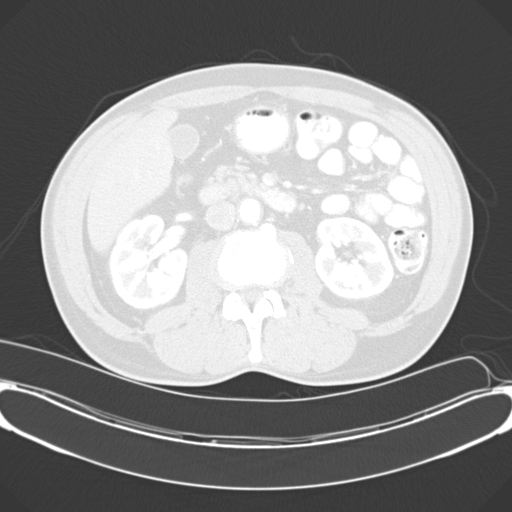
[im 37/74  soft-tissue]
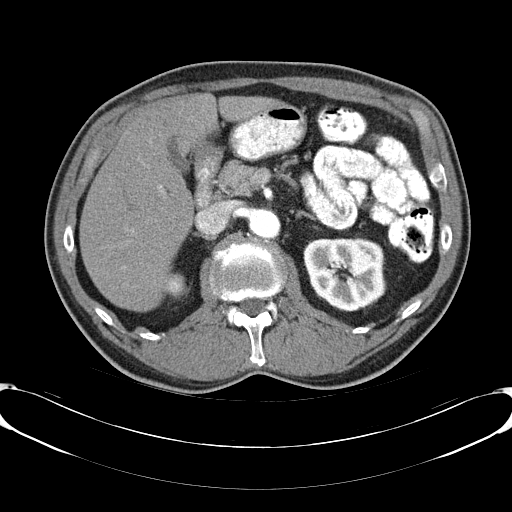
[im 37/74  lung]
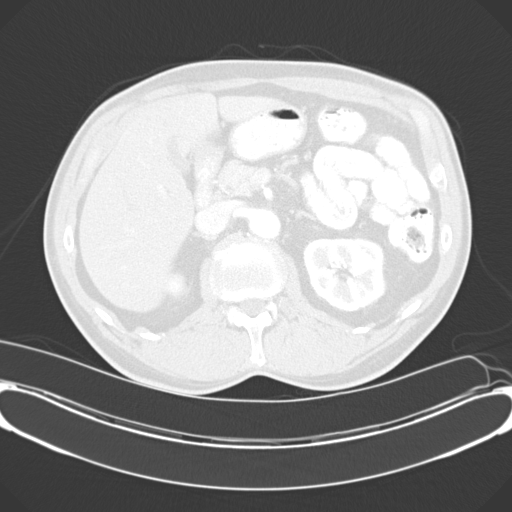
[im 49/74  soft-tissue]
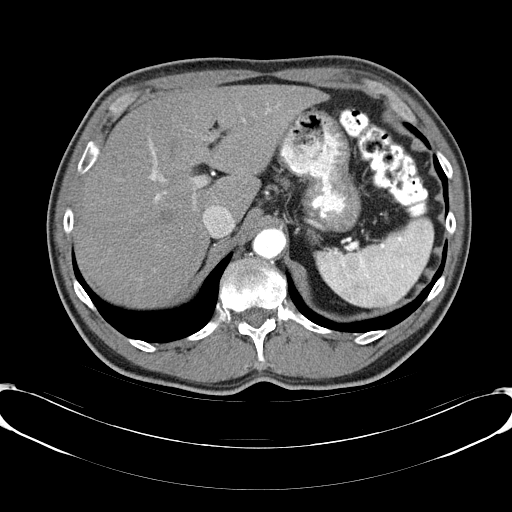
[im 49/74  lung]
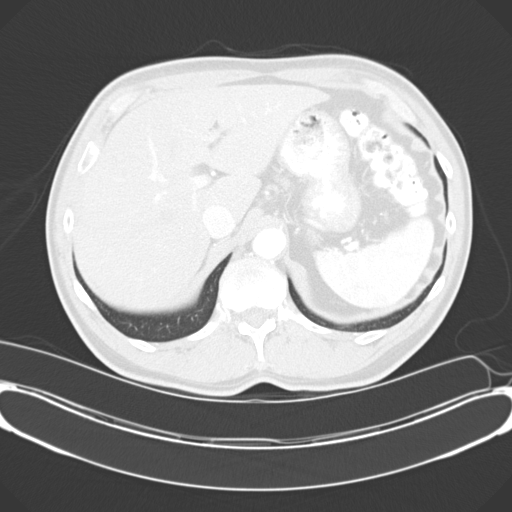
[im 61/74  soft-tissue]
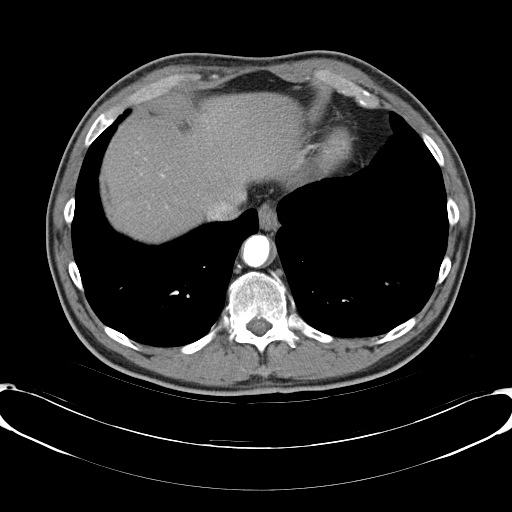
[im 61/74  lung]
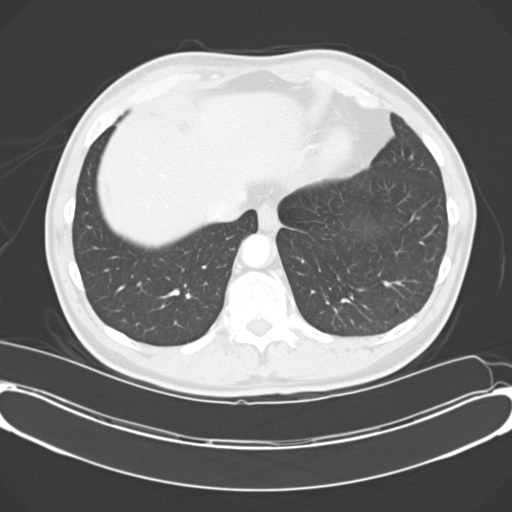

[11 of 46 positions shown; findings below may reference images not displayed]

FINDINGS: Lung bases clear.
Probable tiny splenules adjacent to spleen.
Liver, spleen, pancreas, kidneys, and adrenal glands normal
appearance.
Stomach and visualized bowel loops normal appearance.
Aorta normal caliber.
No mass, adenopathy, free fluid or inflammatory process.
Left spondylolysis L5, right L5 incompletely visualized.
Lucent foci in Mono bilaterally, uncertain etiology.
IMPRESSION: No acute intra abdominal abnormalities.
Lucent foci in the Mono bilaterally, nonspecific but stable since
prior study.

## 2013-06-17 ENCOUNTER — Other Ambulatory Visit: Payer: Self-pay | Admitting: Internal Medicine

## 2013-06-17 ENCOUNTER — Ambulatory Visit (INDEPENDENT_AMBULATORY_CARE_PROVIDER_SITE_OTHER): Payer: Medicare Other | Admitting: Gastroenterology

## 2013-06-17 ENCOUNTER — Encounter: Payer: Self-pay | Admitting: Gastroenterology

## 2013-06-17 ENCOUNTER — Encounter (INDEPENDENT_AMBULATORY_CARE_PROVIDER_SITE_OTHER): Payer: Self-pay

## 2013-06-17 VITALS — BP 150/83 | HR 57 | Temp 98.4°F | Ht 70.0 in | Wt 147.2 lb

## 2013-06-17 DIAGNOSIS — R1011 Right upper quadrant pain: Secondary | ICD-10-CM | POA: Diagnosis not present

## 2013-06-17 DIAGNOSIS — R1013 Epigastric pain: Secondary | ICD-10-CM

## 2013-06-17 DIAGNOSIS — R1319 Other dysphagia: Secondary | ICD-10-CM | POA: Insufficient documentation

## 2013-06-17 DIAGNOSIS — R1314 Dysphagia, pharyngoesophageal phase: Secondary | ICD-10-CM | POA: Diagnosis not present

## 2013-06-17 DIAGNOSIS — G8929 Other chronic pain: Secondary | ICD-10-CM

## 2013-06-17 DIAGNOSIS — R1310 Dysphagia, unspecified: Secondary | ICD-10-CM | POA: Insufficient documentation

## 2013-06-17 DIAGNOSIS — K219 Gastro-esophageal reflux disease without esophagitis: Secondary | ICD-10-CM

## 2013-06-17 DIAGNOSIS — B182 Chronic viral hepatitis C: Secondary | ICD-10-CM

## 2013-06-17 DIAGNOSIS — B1920 Unspecified viral hepatitis C without hepatic coma: Secondary | ICD-10-CM | POA: Diagnosis not present

## 2013-06-17 LAB — CBC WITH DIFFERENTIAL/PLATELET
BASOS PCT: 0 % (ref 0–1)
Basophils Absolute: 0 10*3/uL (ref 0.0–0.1)
EOS ABS: 0.2 10*3/uL (ref 0.0–0.7)
Eosinophils Relative: 3 % (ref 0–5)
HEMATOCRIT: 40 % (ref 39.0–52.0)
HEMOGLOBIN: 13.8 g/dL (ref 13.0–17.0)
Lymphocytes Relative: 26 % (ref 12–46)
Lymphs Abs: 2.2 10*3/uL (ref 0.7–4.0)
MCH: 31.7 pg (ref 26.0–34.0)
MCHC: 34.5 g/dL (ref 30.0–36.0)
MCV: 91.7 fL (ref 78.0–100.0)
MONO ABS: 0.9 10*3/uL (ref 0.1–1.0)
MONOS PCT: 11 % (ref 3–12)
Neutro Abs: 5 10*3/uL (ref 1.7–7.7)
Neutrophils Relative %: 60 % (ref 43–77)
Platelets: 245 10*3/uL (ref 150–400)
RBC: 4.36 MIL/uL (ref 4.22–5.81)
RDW: 14 % (ref 11.5–15.5)
WBC: 8.3 10*3/uL (ref 4.0–10.5)

## 2013-06-17 LAB — BASIC METABOLIC PANEL
BUN: 13 mg/dL (ref 6–23)
CALCIUM: 9.5 mg/dL (ref 8.4–10.5)
CO2: 30 mEq/L (ref 19–32)
CREATININE: 0.87 mg/dL (ref 0.50–1.35)
Chloride: 103 mEq/L (ref 96–112)
Glucose, Bld: 98 mg/dL (ref 70–99)
Potassium: 5 mEq/L (ref 3.5–5.3)
Sodium: 139 mEq/L (ref 135–145)

## 2013-06-17 LAB — HEPATIC FUNCTION PANEL
ALBUMIN: 4.4 g/dL (ref 3.5–5.2)
ALT: 20 U/L (ref 0–53)
AST: 20 U/L (ref 0–37)
Alkaline Phosphatase: 40 U/L (ref 39–117)
Bilirubin, Direct: 0.1 mg/dL (ref 0.0–0.3)
Indirect Bilirubin: 0.3 mg/dL (ref 0.2–1.2)
TOTAL PROTEIN: 7.8 g/dL (ref 6.0–8.3)
Total Bilirubin: 0.4 mg/dL (ref 0.2–1.2)

## 2013-06-17 LAB — LIPASE: LIPASE: 10 U/L (ref 0–75)

## 2013-06-17 NOTE — Assessment & Plan Note (Signed)
63 year old with chronic epigastric/right upper quadrant pain. Complains of more frequent pain with radiation into his back. Symptoms unrelated to meals. Typical heartburn well controlled on Nexium. Recently complained of clay-colored stools but no report or jaundice. Evaluate at this time with upper endoscopy. Obtain labs. Based on EGD findings may consider CT as next step to evaluate pain and surveillance for hepatoma.

## 2013-06-17 NOTE — Assessment & Plan Note (Signed)
Solid food esophageal dysphagia with history of food impactions. Recommend upper endoscopy with dilation in the near future. Patient has history of chronic narcotic use but historically has done well with conscious sedation. Will augment with Phenergan 25 mg IV 30 minutes before the procedure.  I have discussed the risks, alternatives, benefits with regards to but not limited to the risk of reaction to medication, bleeding, infection, perforation and the patient is agreeable to proceed. Written consent to be obtained.  In the interim he will consume soft mechanical diet and drink plenty of fluid with his meals and medications.

## 2013-06-17 NOTE — Assessment & Plan Note (Signed)
Due for updated labs and liver imaging. Patient may be interested in new hepatitis treatment for chronic HCV, genotype 1A. To discuss with Dr. Gala Romney, consider local treatment or referral to hepatitis clinic. Further recommendations to follow.

## 2013-06-17 NOTE — Patient Instructions (Signed)
1. Please have your bloodwork done. 2. Upper endoscopy and esophageal dilation with Dr. Gala Romney. Please see separate instructions.
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
## 2013-06-17 NOTE — Progress Notes (Signed)
Cc'd to pcp 

## 2013-06-19 ENCOUNTER — Encounter (HOSPITAL_COMMUNITY): Payer: Self-pay | Admitting: Pharmacy Technician

## 2013-06-22 MED ORDER — PROMETHAZINE HCL 25 MG/ML IJ SOLN: 25.0000 mg | Freq: Once | INTRAMUSCULAR | Status: DC

## 2013-06-23 ENCOUNTER — Encounter (HOSPITAL_COMMUNITY): Payer: Self-pay | Admitting: *Deleted

## 2013-06-23 ENCOUNTER — Ambulatory Visit (HOSPITAL_COMMUNITY)
Admission: RE | Admit: 2013-06-23 | Discharge: 2013-06-23 | Disposition: A | Discharge status: Home / Self Care | Payer: Medicare Other | Source: Ambulatory Visit | Attending: Internal Medicine | Admitting: Internal Medicine

## 2013-06-23 ENCOUNTER — Encounter (HOSPITAL_COMMUNITY)
Admission: RE | Disposition: A | Discharge status: Home / Self Care | Payer: Self-pay | Source: Ambulatory Visit | Attending: Internal Medicine

## 2013-06-23 DIAGNOSIS — K2940 Chronic atrophic gastritis without bleeding: Secondary | ICD-10-CM | POA: Insufficient documentation

## 2013-06-23 DIAGNOSIS — R1314 Dysphagia, pharyngoesophageal phase: Secondary | ICD-10-CM

## 2013-06-23 DIAGNOSIS — F17200 Nicotine dependence, unspecified, uncomplicated: Secondary | ICD-10-CM | POA: Insufficient documentation

## 2013-06-23 DIAGNOSIS — K222 Esophageal obstruction: Secondary | ICD-10-CM | POA: Insufficient documentation

## 2013-06-23 DIAGNOSIS — G8929 Other chronic pain: Secondary | ICD-10-CM

## 2013-06-23 DIAGNOSIS — R1310 Dysphagia, unspecified: Secondary | ICD-10-CM | POA: Insufficient documentation

## 2013-06-23 DIAGNOSIS — K449 Diaphragmatic hernia without obstruction or gangrene: Secondary | ICD-10-CM | POA: Diagnosis not present

## 2013-06-23 DIAGNOSIS — I10 Essential (primary) hypertension: Secondary | ICD-10-CM | POA: Diagnosis not present

## 2013-06-23 DIAGNOSIS — B182 Chronic viral hepatitis C: Secondary | ICD-10-CM

## 2013-06-23 DIAGNOSIS — K219 Gastro-esophageal reflux disease without esophagitis: Secondary | ICD-10-CM

## 2013-06-23 DIAGNOSIS — R1013 Epigastric pain: Secondary | ICD-10-CM

## 2013-06-23 DIAGNOSIS — R1319 Other dysphagia: Secondary | ICD-10-CM

## 2013-06-23 HISTORY — PX: ESOPHAGOGASTRODUODENOSCOPY (EGD) WITH ESOPHAGEAL DILATION: SHX5812

## 2013-06-23 SURGERY — ESOPHAGOGASTRODUODENOSCOPY (EGD) WITH ESOPHAGEAL DILATION
Anesthesia: Moderate Sedation

## 2013-06-23 MED ORDER — ONDANSETRON HCL 4 MG/2ML IJ SOLN
INTRAMUSCULAR | Status: AC
  Filled 2013-06-23: qty 2

## 2013-06-23 MED ORDER — PROMETHAZINE HCL 25 MG/ML IJ SOLN
12.5000 mg | Freq: Once | INTRAMUSCULAR | Status: AC
  Administered 2013-06-23: 12.5 mg via INTRAVENOUS

## 2013-06-23 MED ORDER — MEPERIDINE HCL 100 MG/ML IJ SOLN
INTRAMUSCULAR | Status: AC
  Filled 2013-06-23: qty 2

## 2013-06-23 MED ORDER — LIDOCAINE VISCOUS 2 % MT SOLN
OROMUCOSAL | Status: DC | PRN
  Administered 2013-06-23: 1 via OROMUCOSAL

## 2013-06-23 MED ORDER — STERILE WATER FOR IRRIGATION IR SOLN
Status: DC | PRN
  Administered 2013-06-23: 13:00:00

## 2013-06-23 MED ORDER — ONDANSETRON HCL 4 MG/2ML IJ SOLN
INTRAMUSCULAR | Status: DC | PRN
  Administered 2013-06-23: 4 mg via INTRAVENOUS

## 2013-06-23 MED ORDER — MEPERIDINE HCL 100 MG/ML IJ SOLN
INTRAMUSCULAR | Status: DC | PRN
  Administered 2013-06-23: 50 mg via INTRAVENOUS

## 2013-06-23 MED ORDER — LIDOCAINE VISCOUS 2 % MT SOLN
OROMUCOSAL | Status: AC
  Filled 2013-06-23: qty 15

## 2013-06-23 MED ORDER — MIDAZOLAM HCL 5 MG/5ML IJ SOLN
INTRAMUSCULAR | Status: DC | PRN
  Administered 2013-06-23: 2 mg via INTRAVENOUS
  Administered 2013-06-23: 1 mg via INTRAVENOUS

## 2013-06-23 MED ORDER — PROMETHAZINE HCL 25 MG/ML IJ SOLN
INTRAMUSCULAR | Status: AC
  Filled 2013-06-23: qty 1

## 2013-06-23 MED ORDER — SODIUM CHLORIDE 0.9 % IV SOLN
INTRAVENOUS | Status: DC
  Administered 2013-06-23: 12:00:00 via INTRAVENOUS

## 2013-06-23 MED ORDER — MIDAZOLAM HCL 5 MG/5ML IJ SOLN
INTRAMUSCULAR | Status: AC
  Filled 2013-06-23: qty 10

## 2013-06-23 MED ORDER — SODIUM CHLORIDE 0.9 % IJ SOLN
INTRAMUSCULAR | Status: AC
  Filled 2013-06-23: qty 10

## 2013-06-23 NOTE — Discharge Instructions (Addendum)
EGD Discharge instructions Please read the instructions outlined below and refer to this sheet in the next few weeks. These discharge instructions provide you with general information on caring for yourself after you leave the hospital. Your doctor may also give you specific instructions. While your treatment has been planned according to the most current medical practices available, unavoidable complications occasionally occur. If you have any problems or questions after discharge, please call your doctor. ACTIVITY  You may resume your regular activity but move at a slower pace for the next 24 hours.   Take frequent rest periods for the next 24 hours.   Walking will help expel (get rid of) the air and reduce the bloated feeling in your abdomen.   No driving for 24 hours (because of the anesthesia (medicine) used during the test).   You may shower.   Do not sign any important legal documents or operate any machinery for 24 hours (because of the anesthesia used during the test).  NUTRITION  Drink plenty of fluids.   You may resume your normal diet.   Begin with a light meal and progress to your normal diet.   Avoid alcoholic beverages for 24 hours or as instructed by your caregiver.  MEDICATIONS  You may resume your normal medications unless your caregiver tells you otherwise.  WHAT YOU CAN EXPECT TODAY  You may experience abdominal discomfort such as a feeling of fullness or gas pains.  FOLLOW-UP  Your doctor will discuss the results of your test with you.  SEEK IMMEDIATE MEDICAL ATTENTION IF ANY OF THE FOLLOWING OCCUR:  Excessive nausea (feeling sick to your stomach) and/or vomiting.   Severe abdominal pain and distention (swelling).   Trouble swallowing.   Temperature over 101 F (37.8 C).   Rectal bleeding or vomiting of blood.    GERD information provided  Continue Nexium 40 mg daily   Further recommendations to follow pending review of pathology  report  Gastroesophageal Reflux Disease, Adult Gastroesophageal reflux disease (GERD) happens when acid from your stomach flows up into the esophagus. When acid comes in contact with the esophagus, the acid causes soreness (inflammation) in the esophagus. Over time, GERD may create small holes (ulcers) in the lining of the esophagus. CAUSES  Increased body weight. This puts pressure on the stomach, making acid rise from the stomach into the esophagus. Smoking. This increases acid production in the stomach. Drinking alcohol. This causes decreased pressure in the lower esophageal sphincter (valve or ring of muscle between the esophagus and stomach), allowing acid from the stomach into the esophagus. Late evening meals and a full stomach. This increases pressure and acid production in the stomach. A malformed lower esophageal sphincter. Sometimes, no cause is found. SYMPTOMS  Burning pain in the lower part of the mid-chest behind the breastbone and in the mid-stomach area. This may occur twice a week or more often. Trouble swallowing. Sore throat. Dry cough. Asthma-like symptoms including chest tightness, shortness of breath, or wheezing. DIAGNOSIS  Your caregiver may be able to diagnose GERD based on your symptoms. In some cases, X-rays and other tests may be done to check for complications or to check the condition of your stomach and esophagus. TREATMENT  Your caregiver may recommend over-the-counter or prescription medicines to help decrease acid production. Ask your caregiver before starting or adding any new medicines.  HOME CARE INSTRUCTIONS  Change the factors that you can control. Ask your caregiver for guidance concerning weight loss, quitting smoking, and alcohol consumption. Avoid  foods and drinks that make your symptoms worse, such as: Caffeine or alcoholic drinks. Chocolate. Peppermint or mint flavorings. Garlic and onions. Spicy foods. Citrus fruits, such as oranges, lemons,  or limes. Tomato-based foods such as sauce, chili, salsa, and pizza. Fried and fatty foods. Avoid lying down for the 3 hours prior to your bedtime or prior to taking a nap. Eat small, frequent meals instead of large meals. Wear loose-fitting clothing. Do not wear anything tight around your waist that causes pressure on your stomach. Raise the head of your bed 6 to 8 inches with wood blocks to help you sleep. Extra pillows will not help. Only take over-the-counter or prescription medicines for pain, discomfort, or fever as directed by your caregiver. Do not take aspirin, ibuprofen, or other nonsteroidal anti-inflammatory drugs (NSAIDs). SEEK IMMEDIATE MEDICAL CARE IF:  You have pain in your arms, neck, jaw, teeth, or back. Your pain increases or changes in intensity or duration. You develop nausea, vomiting, or sweating (diaphoresis). You develop shortness of breath, or you faint. Your vomit is green, yellow, black, or looks like coffee grounds or blood. Your stool is red, bloody, or black. These symptoms could be signs of other problems, such as heart disease, gastric bleeding, or esophageal bleeding. MAKE SURE YOU:  Understand these instructions. Will watch your condition. Will get help right away if you are not doing well or get worse.
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
## 2013-06-23 NOTE — Op Note (Signed)
[facility] [address] [place], [address]   ENDOSCOPY PROCEDURE REPORT  PATIENT: Mathew Grant, Mathew Grant  MR#: [id number] BIRTHDATE: November 25, 1950 , 75  yrs. old GENDER: Male ENDOSCOPIST: R.  Garfield Cornea, MD FACP FACG REFERRED BY:  Matthias Hughs, M.D. PROCEDURE DATE:  06/23/2013 PROCEDURE:    EGD with Venia Minks dilation followed by gastric biopsy  INDICATIONS:    GERD; esophageal dysphagia  INFORMED CONSENT:   The risks, benefits, limitations, alternatives and imponderables have been discussed.  The potential for biopsy, esophogeal dilation, etc. have also been reviewed.  Questions have been answered.  All parties agreeable.  Please see the history and physical in the medical record for more information.  MEDICATIONS:    Versed 3 mg IV and Demerol 50 mg IV in divided doses. Phenergan 25 mg IV and Zofran 4 mg IV. Xylocaine 2% gel orally  DESCRIPTION OF PROCEDURE:   The EG-[id number] ([id number])  endoscope was introduced through the mouth and advanced to the second portion of the duodenum without difficulty or limitations.  The mucosal surfaces were surveyed very carefully during advancement of the scope and upon withdrawal.  Retroflexion view of the proximal stomach and esophagogastric junction was performed.      FINDINGS:      Schatzki's ring; otherwise normal-appearing esophagus. No evidence of Barrett's esophagus. No esophageal varices. Stomach empty. Small hiatal hernia. Diffuse mottling or mosaic-appearing gastric mucosa. No ulcer or infiltrating process. Patent pylorus. Normal first and second portion of the duodenum  THERAPEUTIC / DIAGNOSTIC MANEUVERS PERFORMED:  A 56 French Maloney dilator was passed to full insertion easily. A look, bilirubin ruptured nicely without apparent complication. Subsequently, biopsies of the normal gastric mucosa were taken for histologic study   COMPLICATIONS:  None  IMPRESSION:  Schatzki's ring  -  status post dilation as  described above. Hiatal hernia. Abnormal gastric mucosa - status post biopsy  RECOMMENDATIONS:  Followup on pathology. Continue Nexium daily. Further recommendations to follow.    _______________________________ R. Garfield Cornea, MD FACP [facility] eSigned:  R. Garfield Cornea, MD FACP [facility] 06/23/2013 1:36 PM     CC:  PATIENT NAME:  [name], [name] MR#: [id number]

## 2013-06-23 NOTE — Interval H&P Note (Signed)
History and Physical Interval Note:  06/23/2013 1:04 PM  Mathew Grant  has presented today for surgery, with the diagnosis of EPIGASTRIC PAIN, ESOPHAGEAL DYSPHAGIA AND CHRONIC HCV  The various methods of treatment have been discussed with the patient and family. After consideration of risks, benefits and other options for treatment, the patient has consented to  Procedure(s) with comments: ESOPHAGOGASTRODUODENOSCOPY (EGD) WITH ESOPHAGEAL DILATION (N/A) - 12:45 as a surgical intervention .  The patient's history has been reviewed, patient examined, no change in status, stable for surgery.  I have reviewed the patient's chart and labs.  Questions were answered to the patient's satisfaction.     [name]  No change. EGD with possible esophageal dilation as appropriate.  The risks, benefits, limitations, alternatives and imponderables have been reviewed with the patient. Potential for esophageal dilation, biopsy, etc. have also been reviewed.  Questions have been answered. All parties agreeable.

## 2013-06-24 DIAGNOSIS — K2970 Gastritis, unspecified, without bleeding: Secondary | ICD-10-CM | POA: Diagnosis not present

## 2013-06-24 DIAGNOSIS — K2990 Gastroduodenitis, unspecified, without bleeding: Secondary | ICD-10-CM | POA: Diagnosis not present

## 2013-06-25 ENCOUNTER — Encounter: Payer: Self-pay | Admitting: Internal Medicine

## 2013-06-26 ENCOUNTER — Encounter (HOSPITAL_COMMUNITY): Payer: Self-pay | Admitting: Internal Medicine

## 2013-07-27 DIAGNOSIS — I10 Essential (primary) hypertension: Secondary | ICD-10-CM | POA: Diagnosis not present

## 2013-08-31 DIAGNOSIS — C679 Malignant neoplasm of bladder, unspecified: Secondary | ICD-10-CM | POA: Diagnosis not present

## 2013-08-31 DIAGNOSIS — C61 Malignant neoplasm of prostate: Secondary | ICD-10-CM | POA: Diagnosis not present

## 2013-09-01 ENCOUNTER — Telehealth: Payer: Self-pay

## 2013-09-01 NOTE — Telephone Encounter (Signed)
Pt called to make an appointment and I told him the first thing we had was for May 27 @ 10:00 with LSL. He wanted them to know that his stomach has not been right ever since his EGD. Hs can only eat crackers and water. Please advise

## 2013-09-01 NOTE — Telephone Encounter (Signed)
Let's offer him urgent OV next week.

## 2013-09-02 NOTE — Telephone Encounter (Signed)
Tried to call with no answer  

## 2013-09-02 NOTE — Telephone Encounter (Signed)
Pt is coming in Wednesday at 11:30 to see LSL

## 2013-09-09 ENCOUNTER — Other Ambulatory Visit: Payer: Self-pay | Admitting: Gastroenterology

## 2013-09-09 ENCOUNTER — Encounter (INDEPENDENT_AMBULATORY_CARE_PROVIDER_SITE_OTHER): Payer: Self-pay

## 2013-09-09 ENCOUNTER — Ambulatory Visit (INDEPENDENT_AMBULATORY_CARE_PROVIDER_SITE_OTHER): Payer: Medicare Other | Admitting: Gastroenterology

## 2013-09-09 ENCOUNTER — Encounter: Payer: Self-pay | Admitting: Gastroenterology

## 2013-09-09 VITALS — BP 143/79 | HR 59 | Temp 97.4°F | Ht 68.0 in | Wt 140.6 lb

## 2013-09-09 DIAGNOSIS — D494 Neoplasm of unspecified behavior of bladder: Secondary | ICD-10-CM | POA: Diagnosis not present

## 2013-09-09 DIAGNOSIS — R634 Abnormal weight loss: Secondary | ICD-10-CM | POA: Insufficient documentation

## 2013-09-09 DIAGNOSIS — R1012 Left upper quadrant pain: Secondary | ICD-10-CM | POA: Diagnosis not present

## 2013-09-09 DIAGNOSIS — B1920 Unspecified viral hepatitis C without hepatic coma: Secondary | ICD-10-CM

## 2013-09-09 DIAGNOSIS — R1013 Epigastric pain: Secondary | ICD-10-CM

## 2013-09-09 DIAGNOSIS — Z8546 Personal history of malignant neoplasm of prostate: Secondary | ICD-10-CM

## 2013-09-09 LAB — CBC WITH DIFFERENTIAL/PLATELET
BASOS PCT: 1 % (ref 0–1)
Basophils Absolute: 0.1 10*3/uL (ref 0.0–0.1)
EOS ABS: 0.2 10*3/uL (ref 0.0–0.7)
Eosinophils Relative: 2 % (ref 0–5)
HEMATOCRIT: 39.7 % (ref 39.0–52.0)
Hemoglobin: 14 g/dL (ref 13.0–17.0)
Lymphocytes Relative: 30 % (ref 12–46)
Lymphs Abs: 2.5 10*3/uL (ref 0.7–4.0)
MCH: 31.1 pg (ref 26.0–34.0)
MCHC: 35.3 g/dL (ref 30.0–36.0)
MCV: 88.2 fL (ref 78.0–100.0)
MONO ABS: 0.8 10*3/uL (ref 0.1–1.0)
Monocytes Relative: 10 % (ref 3–12)
Neutro Abs: 4.8 10*3/uL (ref 1.7–7.7)
Neutrophils Relative %: 57 % (ref 43–77)
PLATELETS: 230 10*3/uL (ref 150–400)
RBC: 4.5 MIL/uL (ref 4.22–5.81)
RDW: 13.5 % (ref 11.5–15.5)
WBC: 8.4 10*3/uL (ref 4.0–10.5)

## 2013-09-09 LAB — LIPASE: Lipase: <10 U/L (ref 0–75)

## 2013-09-09 NOTE — Assessment & Plan Note (Signed)
Acute on chronic epigastric pain. Now with LUQ pain. 7 pound weight loss due to inability to eat. EGD as outlined above. GB remains in situ, negative gb on u/s last year. Normal HIDA in 2011. At this point, CT A/P with contrast. Update labs.

## 2013-09-09 NOTE — Progress Notes (Signed)
Primary Care Physician: Deloria Lair, MD  Primary Gastroenterologist:  Garfield Cornea, MD   Chief Complaint  Patient presents with  . Bloated  . Abdominal Pain    can not eat    HPI: Mathew Grant is a 63 y.o. male here for urgent followup. He called in last week with complaints of being unable to eat since his EGD back in February. He was noted to have a Schatzki ring which was dilated. Hiatal hernia. Diffuse mottling or mosaic appearing gastric mucosa. Biopsy showed chronic inflammation but no H. Pylori.   He has a history of chronic right upper eartburn abdominal pain, GERD, untreated chronic HCV. Takes Nexium only several times per week. Weight down 7 pounds since 06/2013.  Epigastric/LUQ. Cannot tolerated food due to the pain. Thought it was new BP pill but they changed it and symptoms persisted. Tries eating very small amount. Only eating crackers and water at this point. Taking Nexium every day. Pain in epigastrium/LUQ. Some suprapubic pain. Recently saw urologist and at follow up cystoscopy for h/o bladder cancer. BMs regular. No melena, brbpr.  BM regular. No melena, brbpr.   Current Outpatient Prescriptions  Medication Sig Dispense Refill  . atenolol (TENORMIN) 100 MG tablet Take 100 mg by mouth at bedtime.      Marland Kitchen esomeprazole (NEXIUM) 40 MG capsule Take 40 mg by mouth daily as needed.      Marland Kitchen oxyCODONE-acetaminophen (PERCOCET) 7.5-325 MG per tablet Take 1 tablet by mouth every 8 (eight) hours as needed.       . triamterene-hydrochlorothiazide (MAXZIDE-25) 37.5-25 MG per tablet Take 1 tablet by mouth daily.        No current facility-administered medications for this visit.    Allergies as of 09/09/2013  . (No Known Allergies)    ROS:  General: Complains of 7 pound weight loss, fatigue. Negative for fever, chills. ENT: Negative for hoarseness, difficulty swallowing , nasal congestion. CV: Negative for chest pain, angina, palpitations, dyspnea on exertion,  peripheral edema.  Respiratory: Negative for dyspnea at rest, dyspnea on exertion, cough, sputum, wheezing.  GI: See history of present illness. GU:  Negative for dysuria, hematuria, urinary incontinence, urinary frequency, nocturnal urination.  Endo: See history of present illness   Physical Examination:   BP 143/79  Pulse 59  Temp(Src) 97.4 F (36.3 C) (Oral)  Ht 5\' 8"  (1.727 m)  Wt 140 lb 9.6 oz (63.776 kg)  BMI 21.38 kg/m2  General: Well-nourished, well-developed in no acute distress.  Eyes: No icterus. Mouth: Oropharyngeal mucosa moist and pink , no lesions erythema or exudate. Lungs: Clear to auscultation bilaterally.  Heart: Regular rate and rhythm, no murmurs rubs or gallops.  Abdomen: Bowel sounds are normal, moderate epigastric/left upper quadrant tenderness, nondistended, no hepatosplenomegaly or masses, no abdominal bruits or hernia , no rebound or guarding.   Extremities: No lower extremity edema. No clubbing or deformities. Neuro: Alert and oriented x 4   Skin: Warm and dry, no jaundice.   Psych: Alert and cooperative, normal mood and affect.  Labs:  Lab Results  Component Value Date   WBC 8.3 06/17/2013   HGB 13.8 06/17/2013   HCT 40.0 06/17/2013   MCV 91.7 06/17/2013   PLT 245 06/17/2013   Lab Results  Component Value Date   ALT 20 06/17/2013   AST 20 06/17/2013   ALKPHOS 40 06/17/2013   BILITOT 0.4 06/17/2013   Lab Results  Component Value Date   LIPASE 10 06/17/2013  Lab Results  Component Value Date   CREATININE 0.87 06/17/2013   BUN 13 06/17/2013   NA 139 06/17/2013   K 5.0 06/17/2013   CL 103 06/17/2013   CO2 30 06/17/2013    Imaging Studies: No results found.

## 2013-09-09 NOTE — Progress Notes (Signed)
cc'd to pcp 

## 2013-09-09 NOTE — Assessment & Plan Note (Signed)
Consider HCV treatment was patient back at baseline with regards to GI issues.

## 2013-09-09 NOTE — Patient Instructions (Signed)
1. Please have your lab work and CT scan done. 2. Continue Nexium every day.

## 2013-09-09 NOTE — Progress Notes (Signed)
Quick Note:  Await CMET and CT. CBC and lipase normal. ______

## 2013-09-14 ENCOUNTER — Ambulatory Visit (HOSPITAL_COMMUNITY)
Admission: RE | Admit: 2013-09-14 | Discharge: 2013-09-14 | Disposition: A | Discharge status: Home / Self Care | Payer: Medicare Other | Source: Ambulatory Visit | Attending: Gastroenterology | Admitting: Gastroenterology

## 2013-09-14 ENCOUNTER — Encounter (HOSPITAL_COMMUNITY): Payer: Self-pay

## 2013-09-14 DIAGNOSIS — R634 Abnormal weight loss: Secondary | ICD-10-CM

## 2013-09-14 DIAGNOSIS — Z8546 Personal history of malignant neoplasm of prostate: Secondary | ICD-10-CM | POA: Insufficient documentation

## 2013-09-14 DIAGNOSIS — R1013 Epigastric pain: Secondary | ICD-10-CM | POA: Diagnosis not present

## 2013-09-14 DIAGNOSIS — B1920 Unspecified viral hepatitis C without hepatic coma: Secondary | ICD-10-CM

## 2013-09-14 DIAGNOSIS — R1012 Left upper quadrant pain: Secondary | ICD-10-CM | POA: Insufficient documentation

## 2013-09-14 DIAGNOSIS — D494 Neoplasm of unspecified behavior of bladder: Secondary | ICD-10-CM

## 2013-09-14 LAB — COMPREHENSIVE METABOLIC PANEL
ALBUMIN: 4.6 g/dL (ref 3.5–5.2)
ALT: 15 U/L (ref 0–53)
AST: 19 U/L (ref 0–37)
Alkaline Phosphatase: 38 U/L — ABNORMAL LOW (ref 39–117)
BUN: 12 mg/dL (ref 6–23)
CO2: 27 mEq/L (ref 19–32)
CREATININE: 0.99 mg/dL (ref 0.50–1.35)
Calcium: 9.9 mg/dL (ref 8.4–10.5)
Chloride: 101 mEq/L (ref 96–112)
GLUCOSE: 101 mg/dL — AB (ref 70–99)
Potassium: 4.5 mEq/L (ref 3.5–5.3)
Sodium: 138 mEq/L (ref 135–145)
Total Bilirubin: 0.6 mg/dL (ref 0.2–1.2)
Total Protein: 8.4 g/dL — ABNORMAL HIGH (ref 6.0–8.3)

## 2013-09-14 MED ORDER — IOHEXOL 300 MG/ML  SOLN
100.0000 mL | Freq: Once | INTRAMUSCULAR | Status: AC | PRN
  Administered 2013-09-14: 100 mL via INTRAVENOUS

## 2013-09-16 ENCOUNTER — Telehealth: Payer: Self-pay

## 2013-09-16 NOTE — Progress Notes (Signed)
Quick Note:  Please let patient know that his labs and CT were unremarkable.  I would recommend HIDA with fatty meal challenge for epig pain. Increase Nexium to 40mg  BID. ______

## 2013-09-16 NOTE — Telephone Encounter (Signed)
I gave pt his results.

## 2013-09-16 NOTE — Telephone Encounter (Signed)
Pt calling to see what his results are from his CT scan

## 2013-09-16 NOTE — Telephone Encounter (Signed)
See result note.  

## 2013-09-17 ENCOUNTER — Other Ambulatory Visit: Payer: Self-pay | Admitting: Gastroenterology

## 2013-09-17 DIAGNOSIS — R1013 Epigastric pain: Secondary | ICD-10-CM

## 2013-09-23 ENCOUNTER — Encounter (HOSPITAL_COMMUNITY): Payer: Self-pay

## 2013-09-23 ENCOUNTER — Ambulatory Visit (HOSPITAL_COMMUNITY)
Admission: RE | Admit: 2013-09-23 | Discharge: 2013-09-23 | Disposition: A | Discharge status: Home / Self Care | Payer: Medicare Other | Source: Ambulatory Visit | Attending: Gastroenterology | Admitting: Gastroenterology

## 2013-09-23 DIAGNOSIS — R1013 Epigastric pain: Secondary | ICD-10-CM | POA: Insufficient documentation

## 2013-09-23 HISTORY — DX: Systemic involvement of connective tissue, unspecified: M35.9

## 2013-09-23 MED ORDER — SINCALIDE 5 MCG IJ SOLR
INTRAMUSCULAR | Status: AC
  Administered 2013-09-23: 1.32 ug via INTRAVENOUS
  Filled 2013-09-23: qty 5

## 2013-09-23 MED ORDER — TECHNETIUM TC 99M MEBROFENIN IV KIT
5.0000 | PACK | Freq: Once | INTRAVENOUS | Status: AC | PRN
  Administered 2013-09-23: 5 via INTRAVENOUS

## 2013-09-23 MED ORDER — STERILE WATER FOR INJECTION IJ SOLN
INTRAMUSCULAR | Status: AC
  Administered 2013-09-23: 1.32 mL via INTRAVENOUS
  Filled 2013-09-23: qty 10

## 2013-09-29 NOTE — Progress Notes (Signed)
Quick Note:  Please ask patient if the pain he experienced with CCK was the same pain as he has been having in the past. GB function was normal. ______

## 2013-10-07 ENCOUNTER — Ambulatory Visit: Payer: Medicare Other | Admitting: Gastroenterology

## 2013-10-07 ENCOUNTER — Telehealth: Payer: Self-pay | Admitting: Gastroenterology

## 2013-10-07 NOTE — Telephone Encounter (Signed)
This appointment should have been cancelled. He was seen urgently couple of weeks ago.

## 2013-10-07 NOTE — Telephone Encounter (Signed)
Pt was a no show

## 2013-10-13 NOTE — Progress Notes (Signed)
Quick Note:  Please let patient know I discussed findings with Dr. Gala Romney. How is patient doing? If he is still have abdominal pain and vomiting with meals, let's offer surgical consultation (per RMR) for consideration of getting gb out. We also need to request liver biopsy at time of surgery if he has surgery. PLEASE MAKE SURGEON AND PATIENT AWARE OF NEED FOR LIVER BIOPSY.  ______

## 2013-10-14 ENCOUNTER — Other Ambulatory Visit: Payer: Self-pay | Admitting: Gastroenterology

## 2013-10-14 DIAGNOSIS — G8929 Other chronic pain: Secondary | ICD-10-CM

## 2013-10-14 DIAGNOSIS — R1013 Epigastric pain: Principal | ICD-10-CM

## 2013-10-19 ENCOUNTER — Encounter: Payer: Self-pay | Admitting: Gastroenterology

## 2013-11-19 DIAGNOSIS — K811 Chronic cholecystitis: Secondary | ICD-10-CM | POA: Diagnosis not present

## 2013-11-20 NOTE — Patient Instructions (Addendum)
Laparoscopic Cholecystectomy Laparoscopic cholecystectomy is surgery to remove the gallbladder. The gallbladder is located in the upper right part of the abdomen, behind the liver. It is a storage sac for bile produced in the liver. Bile aids in the digestion and absorption of fats. Cholecystectomy is often done for inflammation of the gallbladder (cholecystitis). This condition is usually caused by a buildup of gallstones (cholelithiasis) in your gallbladder. Gallstones can block the flow of bile, resulting in inflammation and pain. In severe cases, emergency surgery may be required. When emergency surgery is not required, you will have time to prepare for the procedure. Laparoscopic surgery is an alternative to open surgery. Laparoscopic surgery has a shorter recovery time. Your common bile duct may also need to be examined during the procedure. If stones are found in the common bile duct, they may be removed. LET YOUR HEALTH CARE PROVIDER KNOW ABOUT:  Any allergies you have.  All medicines you are taking, including vitamins, herbs, eye drops, creams, and over-the-counter medicines.  Previous problems you or members of your family have had with the use of anesthetics.  Any blood disorders you have.  Previous surgeries you have had.  Medical conditions you have. RISKS AND COMPLICATIONS Generally, this is a safe procedure. However, as with any procedure, complications can occur. Possible complications include:  Infection.  Damage to the common bile duct, nerves, arteries, veins, or other internal organs such as the stomach, liver, or intestines.  Bleeding.  A stone may remain in the common bile duct.  A bile leak from the cyst duct that is clipped when your gallbladder is removed.  The need to convert to open surgery, which requires a larger incision in the abdomen. This may be necessary if your surgeon thinks it is not safe to continue with a laparoscopic procedure. BEFORE THE  PROCEDURE  Ask your health care provider about changing or stopping any regular medicines. You will need to stop taking aspirin or blood thinners at least 5 days prior to surgery.  Do not eat or drink anything after midnight the night before surgery.  Let your health care provider know if you develop a cold or other infectious problem before surgery. PROCEDURE   You will be given medicine to make you sleep through the procedure (general anesthetic). A breathing tube will be placed in your mouth.  When you are asleep, your surgeon will make several small cuts (incisions) in your abdomen.  A thin, lighted tube with a tiny camera on the end (laparoscope) is inserted through one of the small incisions. The camera on the laparoscope sends a picture to a TV screen in the operating room. This gives the surgeon a good view inside your abdomen.  A gas will be pumped into your abdomen. This expands your abdomen so that the surgeon has more room to perform the surgery.  Other tools needed for the procedure are inserted through the other incisions. The gallbladder is removed through one of the incisions.  After the removal of your gallbladder, the incisions will be closed with stitches, staples, or skin glue. AFTER THE PROCEDURE  You will be taken to a recovery area where your progress will be checked often.  You may be allowed to go home the same day if your pain is controlled and you can tolerate liquids. Document Released: 04/30/2005 Document Revised: 02/18/2013 Document Reviewed: 12/10/2012 ExitCare Patient Information 2015 [employer]. This information is not intended to replace advice given to you by your health care provider. Make   sure you discuss any questions you have with your health care provider.  KIPPER BUCH  11/20/2013     Your procedure is scheduled on: 11/25/2013  Report to [facility] at 7948 AM.  Call this number if you have problems the morning of surgery:  [phone]   Remember:   Do not eat food or drink liquids after midnight.   Take these medicines the morning of surgery with A SIP OF WATER: Atenolol, Nexium, Percocet, Maxide   Do not wear jewelry, make-up or nail polish.  Do not wear lotions, powders, or perfumes. You may wear deodorant.  Do not shave 48 hours prior to surgery. Men may shave face and neck.  Do not bring valuables to the hospital.  [facility] is not responsible for any belongings or valuables.               Contacts, dentures or bridgework may not be worn into surgery.  Leave suitcase in the car. After surgery it may be brought to your room.  For patients admitted to the hospital, discharge time is determined by your treatment team.               Patients discharged the day of surgery will not be allowed to drive home.  Name and phone number of your driver:   Special Instructions: Shower using CHG 2 nights before surgery and the night before surgery.  If you shower the day of surgery use CHG.  Use special wash - you have one bottle of CHG for all showers.  You should use approximately 1/3 of the bottle for each shower.   Please read over the following fact sheets that you were given: Pain Booklet, Coughing and Deep Breathing, Surgical Site Infection Prevention and Care and Recovery After Surgery

## 2013-11-21 NOTE — H&P (Signed)
  NTS SOAP Note  Vital Signs:  Vitals as of: 10/16/7844: Systolic 962: Diastolic 80: Heart Rate 60: Temp 96.9F: Height 65ft 8in: Weight 140Lbs 0 Ounces: Pain Level 5: BMI 21.29  BMI : 21.29 kg/m2  Subjective: This 63 Years 65 Months old Male presents for of right upper quadrant abdominal pain.  Has been having intermittent right upper and epigastric pain with nausea for several months now.  Made worse with fatty food.  EGD showed esophageal ring.  U/S negative.  HIDA showed normal ejection fraction with reproducible symptoms with cck injection.  Denies fever, chills, jaundice.  Review of Symptoms:  Constitutional:  fatigue Head:unremarkable    Eyes:unremarkable   Nose/Mouth/Throat:unremarkable Cardiovascular:  unremarkable   Respiratory:  cough Gastrointestin    abdominal pain Genitourinary:unremarkable       joint, neck, and back pain Skin:unremarkable Hematolgic/Lymphatic:unremarkable     Allergic/Immunologic:unremarkable     Past Medical History:    Reviewed  Past Medical History  Surgical History: finger amputations, bladder surgery, herniorrhaphy Medical Problems: hepatitis C, HTN Allergies: nkda Medications: nexium, atenolol, triamterene/HCTZ, oxycodone   Social History:Reviewed  Social History  Preferred Language: English Race:  White Ethnicity: Not Hispanic / Latino Age: 63 Years 65 Months Marital Status:  S Alcohol: unknown   Smoking Status: Current every day smoker reviewed on 11/19/2013 Started Date:  Packs per day: 1.00 Functional Status reviewed on 11/19/2013 ------------------------------------------------ Bathing: Normal Cooking: Normal Dressing: Normal Driving: Normal Eating: Normal Managing Meds: Normal Oral Care: Normal Shopping: Normal Toileting: Normal Transferring: Normal Walking: Normal Cognitive Status reviewed on 11/19/2013 ------------------------------------------------ Attention:  Normal Decision Making: Normal Language: Normal Memory: Normal Motor: Normal Perception: Normal Problem Solving: Normal Visual and Spatial: Normal   Family History:  Reviewed  Family Health History Mother, Deceased; Healthy;  Father, Deceased; Healthy;     Objective Information: General:  Well appearing, well nourished in no distress.   no scleral icterus Heart:  RRR, no murmur or gallop.  Normal S1, S2.  No S3, S4.  Lungs:    CTA bilaterally, no wheezes, rhonchi, rales.  Breathing unlabored. Abdomen:Soft, minimal tenderness in right upper quadrant to deep palpation, ND, normal bowel sounds, no HSM, no masses.  No peritoneal signs.  Assessment:Chronic cholecystitis, hepatitis C  Diagnoses: 575.11 Chronic cholecystitis (Chronic cholecystitis)  Procedures: 95284 - OFFICE OUTPATIENT NEW 30 MINUTES    Plan:  Scheduled for laparoscopic cholecystectomy, liver biopsy on 11/25/13.   Patient Education:Alternative treatments to surgery were discussed with patient (and family).  Risks and benefits  of procedure including bleeding, infection, hepatobiliary injury, and the possibility of an open procedure were fully explained to the patient (and family) who gave informed consent. Patient/family questions were addressed.  Follow-up:Pending Surgery

## 2013-11-23 ENCOUNTER — Encounter (HOSPITAL_COMMUNITY)
Admission: RE | Admit: 2013-11-23 | Discharge: 2013-11-23 | Disposition: A | Discharge status: Home / Self Care | Payer: Medicare Other | Source: Ambulatory Visit | Attending: General Surgery | Admitting: General Surgery

## 2013-11-23 ENCOUNTER — Other Ambulatory Visit: Payer: Self-pay

## 2013-11-23 ENCOUNTER — Encounter (HOSPITAL_COMMUNITY): Payer: Self-pay

## 2013-11-23 DIAGNOSIS — B182 Chronic viral hepatitis C: Secondary | ICD-10-CM | POA: Diagnosis not present

## 2013-11-23 DIAGNOSIS — I10 Essential (primary) hypertension: Secondary | ICD-10-CM | POA: Diagnosis not present

## 2013-11-23 DIAGNOSIS — K811 Chronic cholecystitis: Secondary | ICD-10-CM | POA: Diagnosis not present

## 2013-11-23 DIAGNOSIS — F17200 Nicotine dependence, unspecified, uncomplicated: Secondary | ICD-10-CM | POA: Diagnosis not present

## 2013-11-23 DIAGNOSIS — Z79899 Other long term (current) drug therapy: Secondary | ICD-10-CM | POA: Diagnosis not present

## 2013-11-23 LAB — HEPATIC FUNCTION PANEL
ALT: 17 U/L (ref 0–53)
AST: 21 U/L (ref 0–37)
Albumin: 4 g/dL (ref 3.5–5.2)
Alkaline Phosphatase: 56 U/L (ref 39–117)
Total Bilirubin: 0.4 mg/dL (ref 0.3–1.2)
Total Protein: 8.2 g/dL (ref 6.0–8.3)

## 2013-11-23 LAB — BASIC METABOLIC PANEL
ANION GAP: 8 (ref 5–15)
BUN: 12 mg/dL (ref 6–23)
CO2: 31 meq/L (ref 19–32)
Calcium: 9.8 mg/dL (ref 8.4–10.5)
Chloride: 101 mEq/L (ref 96–112)
Creatinine, Ser: 0.9 mg/dL (ref 0.50–1.35)
GFR calc non Af Amer: 87 mL/min — ABNORMAL LOW (ref >90)
Glucose, Bld: 93 mg/dL (ref 70–99)
POTASSIUM: 5.4 meq/L — AB (ref 3.7–5.3)
Sodium: 140 mEq/L (ref 137–147)

## 2013-11-23 LAB — CBC WITH DIFFERENTIAL/PLATELET
Basophils Absolute: 0 10*3/uL (ref 0.0–0.1)
Basophils Relative: 0 % (ref 0–1)
Eosinophils Absolute: 0.3 10*3/uL (ref 0.0–0.7)
Eosinophils Relative: 3 % (ref 0–5)
HEMATOCRIT: 37.5 % — AB (ref 39.0–52.0)
Hemoglobin: 12.9 g/dL — ABNORMAL LOW (ref 13.0–17.0)
LYMPHS ABS: 2.9 10*3/uL (ref 0.7–4.0)
LYMPHS PCT: 28 % (ref 12–46)
MCH: 31.7 pg (ref 26.0–34.0)
MCHC: 34.4 g/dL (ref 30.0–36.0)
MCV: 92.1 fL (ref 78.0–100.0)
MONO ABS: 1 10*3/uL (ref 0.1–1.0)
MONOS PCT: 10 % (ref 3–12)
NEUTROS PCT: 59 % (ref 43–77)
Neutro Abs: 5.9 10*3/uL (ref 1.7–7.7)
Platelets: 225 10*3/uL (ref 150–400)
RBC: 4.07 MIL/uL — AB (ref 4.22–5.81)
RDW: 13 % (ref 11.5–15.5)
WBC: 10.1 10*3/uL (ref 4.0–10.5)

## 2013-11-23 LAB — PROTIME-INR
INR: 1.02 (ref 0.00–1.49)
Prothrombin Time: 13.4 seconds (ref 11.6–15.2)

## 2013-11-23 MED ORDER — CHLORHEXIDINE GLUCONATE 4 % EX LIQD: 1.0000 "application " | Freq: Once | CUTANEOUS | Status: DC

## 2013-11-23 NOTE — Progress Notes (Signed)
Results for SHERRI, LEVENHAGEN (MRN [id number]) as of 11/23/2013 13:08  Ref. Range 11/23/2013 10:25  Sodium Latest Range: 137-147 mmol/L 140  Potassium Latest Range: 3.7-5.3 mEq/L 5.4 (H)  Chloride Latest Range: 96-112 mEq/L 101  CO2 Latest Range: 19-32 mEq/L 31  BUN Latest Range: 4-21 mg/dL 12  Creatinine Latest Range: 0.50-1.35 mg/dL 0.90  Calcium Latest Range: 8.4-10.5 mg/dL 9.8  GFR calc non Af Amer Latest Range: >90 mL/min 87 (L)  GFR calc Af Amer Latest Range: >90 mL/min >90  Glucose Latest Range: 70-99 mg/dL 93  Anion gap Latest Range: 5-15  8  Alkaline Phosphatase Latest Range: 39-117 U/L 56  Albumin No range found 4.0  AST Latest Range: 0-37 U/L 21  ALT Latest Range: 0-53 U/L 17  Total Protein Latest Range: 6.0-8.3 g/dL 8.2  Bilirubin, Direct Latest Range: 0.0-0.3 mg/dL <0.2  Indirect Bilirubin Latest Range: 0.3-0.9 mg/dL NOT CALCULATED  Total Bilirubin Latest Range: 0.3-1.2 mg/dL 0.4  Results given to Dr Patsey Berthold will do Istat morning of surgery

## 2013-11-25 ENCOUNTER — Ambulatory Visit (HOSPITAL_COMMUNITY)
Admission: RE | Admit: 2013-11-25 | Discharge: 2013-11-25 | Disposition: A | Discharge status: Home / Self Care | Payer: Medicare Other | Source: Ambulatory Visit | Attending: General Surgery | Admitting: General Surgery

## 2013-11-25 ENCOUNTER — Encounter (HOSPITAL_COMMUNITY)
Admission: RE | Disposition: A | Discharge status: Home / Self Care | Payer: Self-pay | Source: Ambulatory Visit | Attending: General Surgery

## 2013-11-25 ENCOUNTER — Encounter (HOSPITAL_COMMUNITY): Payer: Medicare Other | Admitting: Anesthesiology

## 2013-11-25 ENCOUNTER — Encounter (HOSPITAL_COMMUNITY): Payer: Self-pay | Admitting: *Deleted

## 2013-11-25 ENCOUNTER — Ambulatory Visit (HOSPITAL_COMMUNITY): Payer: Medicare Other | Admitting: Anesthesiology

## 2013-11-25 DIAGNOSIS — K811 Chronic cholecystitis: Secondary | ICD-10-CM | POA: Diagnosis not present

## 2013-11-25 DIAGNOSIS — B182 Chronic viral hepatitis C: Secondary | ICD-10-CM | POA: Diagnosis not present

## 2013-11-25 DIAGNOSIS — Z79899 Other long term (current) drug therapy: Secondary | ICD-10-CM | POA: Insufficient documentation

## 2013-11-25 DIAGNOSIS — I10 Essential (primary) hypertension: Secondary | ICD-10-CM | POA: Insufficient documentation

## 2013-11-25 DIAGNOSIS — K8010 Calculus of gallbladder with chronic cholecystitis without obstruction: Secondary | ICD-10-CM | POA: Diagnosis not present

## 2013-11-25 DIAGNOSIS — F17200 Nicotine dependence, unspecified, uncomplicated: Secondary | ICD-10-CM | POA: Insufficient documentation

## 2013-11-25 HISTORY — PX: CHOLECYSTECTOMY: SHX55

## 2013-11-25 HISTORY — PX: LIVER BIOPSY: SHX301

## 2013-11-25 LAB — POCT I-STAT 4, (NA,K, GLUC, HGB,HCT)
Glucose, Bld: 127 mg/dL — ABNORMAL HIGH (ref 70–99)
HCT: 39 % (ref 39.0–52.0)
HEMOGLOBIN: 13.3 g/dL (ref 13.0–17.0)
POTASSIUM: 3.9 meq/L (ref 3.7–5.3)
Sodium: 138 mEq/L (ref 137–147)

## 2013-11-25 SURGERY — LAPAROSCOPIC CHOLECYSTECTOMY
Anesthesia: General | Site: Abdomen

## 2013-11-25 MED ORDER — METOPROLOL TARTRATE 1 MG/ML IV SOLN
INTRAVENOUS | Status: AC
  Filled 2013-11-25: qty 5

## 2013-11-25 MED ORDER — METOPROLOL TARTRATE 1 MG/ML IV SOLN
INTRAVENOUS | Status: DC | PRN
  Administered 2013-11-25: 2 mg via INTRAVENOUS
  Administered 2013-11-25: 3 mg via INTRAVENOUS

## 2013-11-25 MED ORDER — ROCURONIUM BROMIDE 100 MG/10ML IV SOLN
INTRAVENOUS | Status: DC | PRN
  Administered 2013-11-25: 20 mg via INTRAVENOUS
  Administered 2013-11-25: 5 mg via INTRAVENOUS

## 2013-11-25 MED ORDER — GLYCOPYRROLATE 0.2 MG/ML IJ SOLN
INTRAMUSCULAR | Status: DC | PRN
  Administered 2013-11-25: 0.6 mg via INTRAVENOUS

## 2013-11-25 MED ORDER — PROPOFOL 10 MG/ML IV EMUL
INTRAVENOUS | Status: AC
  Filled 2013-11-25: qty 20

## 2013-11-25 MED ORDER — LABETALOL HCL 5 MG/ML IV SOLN
INTRAVENOUS | Status: AC
  Filled 2013-11-25: qty 4

## 2013-11-25 MED ORDER — FENTANYL CITRATE 0.05 MG/ML IJ SOLN
INTRAMUSCULAR | Status: AC
  Filled 2013-11-25: qty 5

## 2013-11-25 MED ORDER — MIDAZOLAM HCL 2 MG/2ML IJ SOLN
INTRAMUSCULAR | Status: AC
  Filled 2013-11-25: qty 2

## 2013-11-25 MED ORDER — PROPOFOL 10 MG/ML IV BOLUS
INTRAVENOUS | Status: DC | PRN
  Administered 2013-11-25: 50 mg via INTRAVENOUS
  Administered 2013-11-25: 150 mg via INTRAVENOUS

## 2013-11-25 MED ORDER — ONDANSETRON HCL 4 MG/2ML IJ SOLN
INTRAMUSCULAR | Status: AC
  Filled 2013-11-25: qty 2

## 2013-11-25 MED ORDER — GLYCOPYRROLATE 0.2 MG/ML IJ SOLN
0.2000 mg | Freq: Once | INTRAMUSCULAR | Status: AC
  Administered 2013-11-25: 0.2 mg via INTRAVENOUS

## 2013-11-25 MED ORDER — OXYCODONE-ACETAMINOPHEN 7.5-325 MG PO TABS: 1.0000 | ORAL_TABLET | ORAL | Status: DC | PRN

## 2013-11-25 MED ORDER — HEMOSTATIC AGENTS (NO CHARGE) OPTIME
TOPICAL | Status: DC | PRN
  Administered 2013-11-25: 1

## 2013-11-25 MED ORDER — POVIDONE-IODINE 10 % OINT PACKET
TOPICAL_OINTMENT | CUTANEOUS | Status: DC | PRN
  Administered 2013-11-25: 1 via TOPICAL

## 2013-11-25 MED ORDER — MIDAZOLAM HCL 2 MG/2ML IJ SOLN
1.0000 mg | INTRAMUSCULAR | Status: DC | PRN
  Administered 2013-11-25: 2 mg via INTRAVENOUS

## 2013-11-25 MED ORDER — NEOSTIGMINE METHYLSULFATE 10 MG/10ML IV SOLN
INTRAVENOUS | Status: DC | PRN
  Administered 2013-11-25: 3 mg via INTRAVENOUS

## 2013-11-25 MED ORDER — SUCCINYLCHOLINE CHLORIDE 20 MG/ML IJ SOLN
INTRAMUSCULAR | Status: AC
  Filled 2013-11-25: qty 1

## 2013-11-25 MED ORDER — POVIDONE-IODINE 10 % EX OINT
TOPICAL_OINTMENT | CUTANEOUS | Status: AC
  Filled 2013-11-25: qty 1

## 2013-11-25 MED ORDER — BUPIVACAINE HCL (PF) 0.5 % IJ SOLN
INTRAMUSCULAR | Status: AC
  Filled 2013-11-25: qty 30

## 2013-11-25 MED ORDER — GLYCOPYRROLATE 0.2 MG/ML IJ SOLN
INTRAMUSCULAR | Status: AC
  Filled 2013-11-25: qty 1

## 2013-11-25 MED ORDER — FENTANYL CITRATE 0.05 MG/ML IJ SOLN
INTRAMUSCULAR | Status: DC | PRN
  Administered 2013-11-25 (×5): 50 ug via INTRAVENOUS
  Administered 2013-11-25: 100 ug via INTRAVENOUS

## 2013-11-25 MED ORDER — KETOROLAC TROMETHAMINE 30 MG/ML IJ SOLN
30.0000 mg | Freq: Once | INTRAMUSCULAR | Status: AC
  Administered 2013-11-25: 30 mg via INTRAVENOUS

## 2013-11-25 MED ORDER — LABETALOL HCL 5 MG/ML IV SOLN
INTRAVENOUS | Status: DC | PRN
  Administered 2013-11-25: 10 mg via INTRAVENOUS

## 2013-11-25 MED ORDER — ONDANSETRON HCL 4 MG/2ML IJ SOLN
4.0000 mg | Freq: Once | INTRAMUSCULAR | Status: AC
  Administered 2013-11-25: 4 mg via INTRAVENOUS

## 2013-11-25 MED ORDER — CEFAZOLIN SODIUM-DEXTROSE 2-3 GM-% IV SOLR
2.0000 g | INTRAVENOUS | Status: AC
  Administered 2013-11-25: 2 g via INTRAVENOUS

## 2013-11-25 MED ORDER — FENTANYL CITRATE 0.05 MG/ML IJ SOLN
INTRAMUSCULAR | Status: AC
  Filled 2013-11-25: qty 2

## 2013-11-25 MED ORDER — ROCURONIUM BROMIDE 50 MG/5ML IV SOLN
INTRAVENOUS | Status: AC
  Filled 2013-11-25: qty 1

## 2013-11-25 MED ORDER — SODIUM CHLORIDE 0.9 % IJ SOLN
INTRAMUSCULAR | Status: AC
  Filled 2013-11-25: qty 10

## 2013-11-25 MED ORDER — GLYCOPYRROLATE 0.2 MG/ML IJ SOLN
INTRAMUSCULAR | Status: AC
  Filled 2013-11-25: qty 2

## 2013-11-25 MED ORDER — FENTANYL CITRATE 0.05 MG/ML IJ SOLN: 25.0000 ug | INTRAMUSCULAR | Status: DC | PRN

## 2013-11-25 MED ORDER — KETOROLAC TROMETHAMINE 30 MG/ML IJ SOLN
INTRAMUSCULAR | Status: AC
  Filled 2013-11-25: qty 1

## 2013-11-25 MED ORDER — MIDAZOLAM HCL 2 MG/2ML IJ SOLN
INTRAMUSCULAR | Status: DC | PRN
  Administered 2013-11-25: 2 mg via INTRAVENOUS

## 2013-11-25 MED ORDER — SODIUM CHLORIDE 0.9 % IR SOLN
Status: DC | PRN
Start: 2013-11-25 — End: 2013-11-25
  Administered 2013-11-25: 1000 mL

## 2013-11-25 MED ORDER — CEFAZOLIN SODIUM-DEXTROSE 2-3 GM-% IV SOLR
INTRAVENOUS | Status: AC
  Filled 2013-11-25: qty 50

## 2013-11-25 MED ORDER — LACTATED RINGERS IV SOLN
INTRAVENOUS | Status: DC
  Administered 2013-11-25 (×2): via INTRAVENOUS

## 2013-11-25 MED ORDER — SUCCINYLCHOLINE CHLORIDE 20 MG/ML IJ SOLN
INTRAMUSCULAR | Status: DC | PRN
  Administered 2013-11-25: 120 mg via INTRAVENOUS

## 2013-11-25 MED ORDER — LIDOCAINE HCL (PF) 1 % IJ SOLN
INTRAMUSCULAR | Status: AC
  Filled 2013-11-25: qty 5

## 2013-11-25 MED ORDER — BUPIVACAINE HCL (PF) 0.5 % IJ SOLN
INTRAMUSCULAR | Status: DC | PRN
Start: 2013-11-25 — End: 2013-11-25
  Administered 2013-11-25: 10 mL

## 2013-11-25 MED ORDER — ONDANSETRON HCL 4 MG/2ML IJ SOLN: 4.0000 mg | Freq: Once | INTRAMUSCULAR | Status: DC | PRN

## 2013-11-25 MED ORDER — LIDOCAINE HCL (CARDIAC) 10 MG/ML IV SOLN
INTRAVENOUS | Status: DC | PRN
  Administered 2013-11-25: 10 mg via INTRAVENOUS

## 2013-11-25 SURGICAL SUPPLY — 49 items
APPLIER CLIP LAPSCP 10X32 DD (CLIP) ×3 IMPLANT
BAG HAMPER (MISCELLANEOUS) ×3 IMPLANT
BLADE 11 SAFETY STRL DISP (BLADE) ×3 IMPLANT
CLOTH BEACON ORANGE TIMEOUT ST (SAFETY) ×3 IMPLANT
COVER LIGHT HANDLE STERIS (MISCELLANEOUS) ×6 IMPLANT
DECANTER SPIKE VIAL GLASS SM (MISCELLANEOUS) ×3 IMPLANT
DURAPREP 26ML APPLICATOR (WOUND CARE) ×3 IMPLANT
ELECT REM PT RETURN 9FT ADLT (ELECTROSURGICAL) ×3
ELECTRODE REM PT RTRN 9FT ADLT (ELECTROSURGICAL) ×1 IMPLANT
FILTER SMOKE EVAC LAPAROSHD (FILTER) ×3 IMPLANT
FORMALIN 10 PREFIL 120ML (MISCELLANEOUS) ×6 IMPLANT
GLOVE BIO SURGEON STRL SZ7 (GLOVE) ×3 IMPLANT
GLOVE BIOGEL PI IND STRL 6.5 (GLOVE) ×1 IMPLANT
GLOVE BIOGEL PI IND STRL 7.0 (GLOVE) ×1 IMPLANT
GLOVE BIOGEL PI IND STRL 7.5 (GLOVE) ×1 IMPLANT
GLOVE BIOGEL PI INDICATOR 6.5 (GLOVE) ×2
GLOVE BIOGEL PI INDICATOR 7.0 (GLOVE) ×2
GLOVE BIOGEL PI INDICATOR 7.5 (GLOVE) ×2
GLOVE ECLIPSE 6.5 STRL STRAW (GLOVE) ×3 IMPLANT
GLOVE EXAM NITRILE LRG STRL (GLOVE) ×3 IMPLANT
GLOVE SS N UNI LF 7.5 STRL (GLOVE) ×3 IMPLANT
GLOVE SURG SS PI 7.5 STRL IVOR (GLOVE) ×6 IMPLANT
GOWN STRL REUS W/TWL LRG LVL3 (GOWN DISPOSABLE) ×12 IMPLANT
HEMOSTAT SNOW SURGICEL 2X4 (HEMOSTASIS) ×3 IMPLANT
INST SET LAPROSCOPIC AP (KITS) ×3 IMPLANT
KIT ROOM TURNOVER APOR (KITS) ×3 IMPLANT
MANIFOLD NEPTUNE II (INSTRUMENTS) ×3 IMPLANT
NEEDLE BIOPSY 14GX4.5 SOFT TIS (NEEDLE) ×3 IMPLANT
NEEDLE HYPO 25X1 1.5 SAFETY (NEEDLE) ×3 IMPLANT
NEEDLE INSUFFLATION 14GA 120MM (NEEDLE) ×3 IMPLANT
NS IRRIG 1000ML POUR BTL (IV SOLUTION) ×3 IMPLANT
PACK LAP CHOLE LZT030E (CUSTOM PROCEDURE TRAY) ×3 IMPLANT
PACK MINOR (CUSTOM PROCEDURE TRAY) ×3 IMPLANT
PAD ARMBOARD 7.5X6 YLW CONV (MISCELLANEOUS) ×3 IMPLANT
POUCH SPECIMEN RETRIEVAL 10MM (ENDOMECHANICALS) ×3 IMPLANT
SET BASIN LINEN APH (SET/KITS/TRAYS/PACK) ×3 IMPLANT
SLEEVE ENDOPATH XCEL 5M (ENDOMECHANICALS) ×3 IMPLANT
SPONGE GAUZE 2X2 8PLY STER LF (GAUZE/BANDAGES/DRESSINGS) ×4
SPONGE GAUZE 2X2 8PLY STRL LF (GAUZE/BANDAGES/DRESSINGS) ×8 IMPLANT
STAPLER VISISTAT (STAPLE) ×3 IMPLANT
SUT VICRYL 0 UR6 27IN ABS (SUTURE) ×3 IMPLANT
SYRINGE CONTROL L 12CC (SYRINGE) ×3 IMPLANT
TAPE CLOTH SURG 4X10 WHT LF (GAUZE/BANDAGES/DRESSINGS) ×3 IMPLANT
TROCAR ENDO BLADELESS 11MM (ENDOMECHANICALS) ×3 IMPLANT
TROCAR XCEL NON-BLD 5MMX100MML (ENDOMECHANICALS) ×3 IMPLANT
TROCAR XCEL UNIV SLVE 11M 100M (ENDOMECHANICALS) ×3 IMPLANT
TUBING INSUFFLATION (TUBING) ×3 IMPLANT
WARMER LAPAROSCOPE (MISCELLANEOUS) ×3 IMPLANT
YANKAUER SUCT 12FT TUBE ARGYLE (SUCTIONS) ×3 IMPLANT

## 2013-11-25 NOTE — Op Note (Signed)
Patient:  Mathew Grant  DOB:  Nov 15, 1950  MRN:  [id number]   Preop Diagnosis:  Chronic cholecystitis, hepatitis C  Postop Diagnosis:  Same  Procedure:  Laparoscopic cholecystectomy, liver biopsy  Surgeon:  Aviva Signs, M.D.  Anes:  General endotracheal  Indications:  Patient is a 63 year old white male with a history of hepatitis C who presents with biliary colic secondary to chronic cholecystitis. The risks and benefits of the procedure including bleeding, infection, hepatobiliary injury, and the possibility of an open procedure were fully explained to the patient, who gave informed consent.  Procedure note:  The patient was placed the supine position. After induction of general endotracheal anesthesia, the abdomen was prepped and draped using usual sterile technique with DuraPrep. Surgical site confirmation was performed.  An infraumbilical incision was made down to the fascia. A Veress needle was introduced into the abdominal cavity and confirmation of placement was done using the saline drop test. The abdomen was then insufflated to 16 mm mercury pressure. An 11 mm trocar was introduced into the abdominal cavity under direct visualization without difficulty. The patient was placed in reverse to number position and additional 11 mm trocar was placed the epigastric region. The liver was inspected and no significant micro or macronodular cirrhosis was noted. A Tru-Cut core biopsy of the right lobe of liver was performed and sent to pathology further examination. The biopsy site was cauterized using Bovie electrocautery. The gallbladder was retracted in a dynamic fashion in order to expose the triangle of Calot. The cystic duct was first identified. Its juncture to the infundibulum was fully identified. Endoclips were placed proximally and distally on the cystic duct, and the cystic duct was divided. This was likewise done cystic artery. The gallbladder was then freed away from the gallbladder  fossa using Bovie electrocautery. The gallbladder was delivered through the epigastric trocar site using an Endo Catch bag. The gallbladder fossa was inspected no abnormal bleeding or bile leakage was noted. Surgicel is placed the gallbladder fossa. All fluid and air were then evacuated from the abdominal cavity prior to removal of the trochars.  All wounds were irrigated with normal saline. All wounds were checked with 0.5% Sensorcaine. The infraumbilical fascia was reapproximated using an 0 Vicryl interrupted suture. All skin incisions were closed using staples. Betadine ointment and dry sterile dressings were applied.  All tape and needle counts were correct at the end of the procedure. Patient was extubated in the operating room and transferred to PACU in stable condition.  Complications:  None  EBL:  Minimal  Specimen:  Gallbladder, liver biopsy

## 2013-11-25 NOTE — Anesthesia Preprocedure Evaluation (Addendum)
Anesthesia Evaluation  Patient identified by MRN, date of birth, ID band Patient awake  General Assessment Comment:Infection precautions  Reviewed: Allergy & Precautions, H&P , NPO status , Patient's Chart, lab work & pertinent test results, reviewed documented beta blocker date and time   Airway Mallampati: II TM Distance: >3 FB     Dental  (+) Poor Dentition, Teeth Intact, Chipped, Missing, Loose, Dental Advisory Given   Pulmonary Current Smoker,  breath sounds clear to auscultation        Cardiovascular hypertension, Pt. on medications and Pt. on home beta blockers Rhythm:Regular Rate:Normal  Denies cardiac symptoms   Neuro/Psych RUE weakness TIACVA, Residual Symptoms negative psych ROS   GI/Hepatic negative GI ROS, hiatal hernia, PUD, GERD-  Medicated and Controlled,(+) Hepatitis -, C  Endo/Other  negative endocrine ROS  Renal/GU negative Renal ROS   Bladder lesion Hx prostate cancer negative genitourinary   Musculoskeletal negative musculoskeletal ROS (+)   Abdominal   Peds negative pediatric ROS (+)  Hematology negative hematology ROS (+)   Anesthesia Other Findings   Reproductive/Obstetrics negative OB ROS                          Anesthesia Physical Anesthesia Plan  ASA: III  Anesthesia Plan: General   Post-op Pain Management:    Induction: Intravenous, Rapid sequence and Cricoid pressure planned  Airway Management Planned: Oral ETT  Additional Equipment:   Intra-op Plan:   Post-operative Plan: Extubation in OR  Informed Consent: I have reviewed the patients History and Physical, chart, labs and discussed the procedure including the risks, benefits and alternatives for the proposed anesthesia with the patient or authorized representative who has indicated his/her understanding and acceptance.   Dental advisory given  Plan Discussed with:   Anesthesia Plan Comments:         Anesthesia Quick Evaluation

## 2013-11-25 NOTE — Discharge Instructions (Signed)

## 2013-11-25 NOTE — Anesthesia Procedure Notes (Signed)
Procedure Name: Intubation Date/Time: 11/25/2013 10:49 AM Performed by: Vista Deck Pre-anesthesia Checklist: Patient identified, Patient being monitored, Timeout performed, Emergency Drugs available and Suction available Patient Re-evaluated:Patient Re-evaluated prior to inductionOxygen Delivery Method: Circle System Utilized Preoxygenation: Pre-oxygenation with 100% oxygen Intubation Type: IV induction, Rapid sequence and Cricoid Pressure applied Ventilation: Oral airway inserted - appropriate to patient size Laryngoscope Size: Sabra Heck and 2 Grade View: Grade I Tube type: Oral Tube size: 7.0 mm Number of attempts: 1 Airway Equipment and Method: stylet Placement Confirmation: ETT inserted through vocal cords under direct vision,  positive ETCO2 and breath sounds checked- equal and bilateral Secured at: 21 cm Tube secured with: Tape Dental Injury: Teeth and Oropharynx as per pre-operative assessment

## 2013-11-25 NOTE — Transfer of Care (Signed)
Immediate Anesthesia Transfer of Care Note  Patient: Mathew Grant  Procedure(s) Performed: Procedure(s): LAPAROSCOPIC CHOLECYSTECTOMY (N/A) LIVER BIOPSY (N/A)  Patient Location: PACU  Anesthesia Type:General  Level of Consciousness: sedated and patient cooperative  Airway & Oxygen Therapy: Patient Spontanous Breathing and non-rebreather face mask  Post-op Assessment: Report given to PACU RN, Post -op Vital signs reviewed and stable and Patient moving all extremities  Post vital signs: Reviewed and stable  Complications: No apparent anesthesia complications

## 2013-11-25 NOTE — Anesthesia Postprocedure Evaluation (Signed)
  Anesthesia Post-op Note  Patient: Mathew Grant  Procedure(s) Performed: Procedure(s): LAPAROSCOPIC CHOLECYSTECTOMY (N/A) LIVER BIOPSY (N/A)  Patient Location: PACU  Anesthesia Type:General  Level of Consciousness: sedated and patient cooperative  Airway and Oxygen Therapy: Patient Spontanous Breathing and non-rebreather face mask  Post-op Pain: mild  Post-op Assessment: Post-op Vital signs reviewed, Patient's Cardiovascular Status Stable, Respiratory Function Stable and Patent Airway  Post-op Vital Signs: Reviewed and stable  Last Vitals:  Filed Vitals:   11/25/13 1152  BP: 213/106  Pulse: 75  Temp: 36.4 C  Resp: 20    Complications: No apparent anesthesia complications

## 2013-11-25 NOTE — Interval H&P Note (Signed)
History and Physical Interval Note:  11/25/2013 10:25 AM  Mathew Grant  has presented today for surgery, with the diagnosis of chronic cholecystitis, hepatitis c  The various methods of treatment have been discussed with the patient and family. After consideration of risks, benefits and other options for treatment, the patient has consented to  Procedure(s): LAPAROSCOPIC CHOLECYSTECTOMY (N/A) LIVER BIOPSY (N/A) as a surgical intervention .  The patient's history has been reviewed, patient examined, no change in status, stable for surgery.  I have reviewed the patient's chart and labs.  Questions were answered to the patient's satisfaction.     Aviva Signs A

## 2013-11-27 ENCOUNTER — Encounter (HOSPITAL_COMMUNITY): Payer: Self-pay | Admitting: General Surgery

## 2013-12-02 ENCOUNTER — Telehealth: Payer: Self-pay | Admitting: Gastroenterology

## 2013-12-02 NOTE — Telephone Encounter (Signed)
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 NO IRON DEPOSITION. inflammatory grade 2, fibrosis staging 2.  Let's offer him consideration of pursuing Hepatitis C treatment in the upcoming months (assuming approved by insurance). I would like for him to have at least one month recovery from surgery. He can make OV to discuss if he is interested. We discussed previously.

## 2013-12-10 NOTE — Telephone Encounter (Signed)
Pt is aware. Mathew Grant please make him an appointment to come in . He is aware that you will be sending him a letter with an appt date.

## 2013-12-10 NOTE — Telephone Encounter (Signed)
Tried to call pt- voicemail has not been set up yet.

## 2013-12-14 ENCOUNTER — Encounter: Payer: Self-pay | Admitting: Gastroenterology

## 2013-12-14 NOTE — Telephone Encounter (Signed)
Pt is aware of OV on 9/8 at 0930 with LSL and appt card mailed

## 2013-12-18 ENCOUNTER — Ambulatory Visit: Payer: Medicare Other | Admitting: Gastroenterology

## 2014-01-11 ENCOUNTER — Other Ambulatory Visit: Payer: Self-pay | Admitting: Gastroenterology

## 2014-01-19 ENCOUNTER — Encounter: Payer: Self-pay | Admitting: Gastroenterology

## 2014-01-19 ENCOUNTER — Encounter (INDEPENDENT_AMBULATORY_CARE_PROVIDER_SITE_OTHER): Payer: Self-pay

## 2014-01-19 ENCOUNTER — Ambulatory Visit (INDEPENDENT_AMBULATORY_CARE_PROVIDER_SITE_OTHER): Payer: Medicare Other | Admitting: Gastroenterology

## 2014-01-19 VITALS — BP 145/83 | HR 57 | Temp 98.2°F | Ht 69.0 in | Wt 139.0 lb

## 2014-01-19 DIAGNOSIS — B182 Chronic viral hepatitis C: Secondary | ICD-10-CM

## 2014-01-19 LAB — CBC WITH DIFFERENTIAL/PLATELET
BASOS PCT: 0 % (ref 0–1)
Basophils Absolute: 0 10*3/uL (ref 0.0–0.1)
EOS ABS: 0.4 10*3/uL (ref 0.0–0.7)
Eosinophils Relative: 4 % (ref 0–5)
HEMATOCRIT: 37.6 % — AB (ref 39.0–52.0)
Hemoglobin: 13.2 g/dL (ref 13.0–17.0)
Lymphocytes Relative: 28 % (ref 12–46)
Lymphs Abs: 2.6 10*3/uL (ref 0.7–4.0)
MCH: 31.2 pg (ref 26.0–34.0)
MCHC: 35.1 g/dL (ref 30.0–36.0)
MCV: 88.9 fL (ref 78.0–100.0)
MONO ABS: 0.9 10*3/uL (ref 0.1–1.0)
MONOS PCT: 10 % (ref 3–12)
Neutro Abs: 5.4 10*3/uL (ref 1.7–7.7)
Neutrophils Relative %: 58 % (ref 43–77)
Platelets: 213 10*3/uL (ref 150–400)
RBC: 4.23 MIL/uL (ref 4.22–5.81)
RDW: 13.8 % (ref 11.5–15.5)
WBC: 9.3 10*3/uL (ref 4.0–10.5)

## 2014-01-19 LAB — BASIC METABOLIC PANEL
BUN: 10 mg/dL (ref 6–23)
CALCIUM: 9.6 mg/dL (ref 8.4–10.5)
CO2: 30 mEq/L (ref 19–32)
Chloride: 103 mEq/L (ref 96–112)
Creat: 0.96 mg/dL (ref 0.50–1.35)
GLUCOSE: 101 mg/dL — AB (ref 70–99)
POTASSIUM: 4.6 meq/L (ref 3.5–5.3)
Sodium: 138 mEq/L (ref 135–145)

## 2014-01-19 LAB — COMPREHENSIVE METABOLIC PANEL
ALT: 19 U/L (ref 0–53)
AST: 23 U/L (ref 0–37)
Albumin: 4.4 g/dL (ref 3.5–5.2)
Alkaline Phosphatase: 46 U/L (ref 39–117)
BILIRUBIN TOTAL: 0.7 mg/dL (ref 0.2–1.2)
BUN: 10 mg/dL (ref 6–23)
CO2: 30 mEq/L (ref 19–32)
Calcium: 9.6 mg/dL (ref 8.4–10.5)
Chloride: 103 mEq/L (ref 96–112)
Creat: 0.96 mg/dL (ref 0.50–1.35)
GLUCOSE: 101 mg/dL — AB (ref 70–99)
Potassium: 4.6 mEq/L (ref 3.5–5.3)
Sodium: 138 mEq/L (ref 135–145)
TOTAL PROTEIN: 8.1 g/dL (ref 6.0–8.3)

## 2014-01-19 LAB — PROTIME-INR
INR: 1.02 (ref <1.50)
Prothrombin Time: 13.4 seconds (ref 11.6–15.2)

## 2014-01-19 NOTE — Patient Instructions (Addendum)
Please read over the [facility] patient information booklet. Call with any questions. Please have your labs done within the next week or so. We will start the process to get your medication approved for HCV treatment.   Discuss with your PCP, consider having your Percocet changed to oxycodone WITHOUT the acetaminophen given your chronic liver disease.

## 2014-01-19 NOTE — Progress Notes (Signed)
Primary Care Physician: Deloria Lair, MD  Primary Gastroenterologist:  Garfield Cornea, MD   Chief Complaint  Patient presents with  . Advice Only    HPI: Mathew Grant is a 63 y.o. male here to consider chronic hepatitis C treatment. Since he was last seen he underwent cholecystectomy for chronic cholecystitis. Per Dr. Arnoldo Morale op note, "The liver was inspected and no significant micro or macronodular cirrhosis was noted."  Since his surgery his abdominal pain has almost completely resolved. He is able to eat just about anything. Really no significant heartburn. Takes Nexium about 3 days a week. BM are regular. No blood in the stool or melena. Weight is stable.   Never treated for HCV. No etoh in 10 years. HIV negative in the past at least twice. Is not taking any over-the-counter medications or herbal medications.  Current Outpatient Prescriptions  Medication Sig Dispense Refill  . atenolol (TENORMIN) 100 MG tablet Take 100 mg by mouth at bedtime.      Marland Kitchen NEXIUM 40 MG capsule TAKE 1 CAPSULE DAILY BEFORE BREAKFAST.  30 capsule  5  . oxyCODONE-acetaminophen (PERCOCET) 7.5-325 MG per tablet Take 1-2 tablets by mouth every 4 (four) hours as needed.  50 tablet  0  . triamterene-hydrochlorothiazide (MAXZIDE-25) 37.5-25 MG per tablet Take 1 tablet by mouth daily.        No current facility-administered medications for this visit.    Allergies as of 01/19/2014  . (No Known Allergies)    ROS:  General: Negative for anorexia, weight loss, fever, chills. + fatigue, debilitating at times ENT: Negative for hoarseness, difficulty swallowing , nasal congestion. CV: Negative for chest pain, angina, palpitations, dyspnea on exertion, peripheral edema.  Respiratory: Negative for dyspnea at rest, dyspnea on exertion, cough, sputum, wheezing.  GI: See history of present illness. GU:  Negative for dysuria, hematuria, urinary incontinence, urinary frequency, nocturnal urination.  Endo:  Negative for unusual weight change.    Physical Examination:   BP 145/83  Pulse 57  Temp(Src) 98.2 F (36.8 C) (Oral)  Ht 5\' 9"  (1.753 m)  Wt 139 lb (63.05 kg)  BMI 20.52 kg/m2  General: Well-nourished, well-developed in no acute distress.  Eyes: No icterus. Mouth: Oropharyngeal mucosa moist and pink , no lesions erythema or exudate. Lungs: Clear to auscultation bilaterally.  Heart: Regular rate and rhythm, no murmurs rubs or gallops.  Abdomen: Bowel sounds are normal, nontender, nondistended, no hepatosplenomegaly or masses, no abdominal bruits or hernia , no rebound or guarding.   Extremities: No lower extremity edema. No clubbing or deformities. Neuro: Alert and oriented x 4   Skin: Warm and dry, no jaundice.   Psych: Alert and cooperative, normal mood and affect.  Labs:  Lab Results  Component Value Date   WBC 10.1 11/23/2013   HGB 13.3 11/25/2013   HCT 39.0 11/25/2013   MCV 92.1 11/23/2013   PLT 225 11/23/2013   Lab Results  Component Value Date   CREATININE 0.90 11/23/2013   BUN 12 11/23/2013   NA 138 11/25/2013   K 3.9 11/25/2013   CL 101 11/23/2013   CO2 31 11/23/2013   Lab Results  Component Value Date   ALT 17 11/23/2013   AST 21 11/23/2013   ALKPHOS 56 11/23/2013   BILITOT 0.4 11/23/2013   Lab Results  Component Value Date   INR 1.02 11/23/2013   INR 1.04 07/26/2009   HCV quantitative from July 2012: 11,700,000 genotype 1A  Imaging Studies: No results  found.

## 2014-01-19 NOTE — Assessment & Plan Note (Addendum)
63 year old gentleman with history of chronic hepatitis C, nave to treatment. Recent cholecystectomy. On exam no evidence of overt cirrhosis. 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  NO IRON DEPOSITION. inflammatory grade 2, fibrosis staging 2. He continues to have significant debilitating fatigue which he's had for years. Discussed at length with the patient. Abdominal pain issues, resolved. Patient is now willing to undergo HCV treatment. Suspect to have significant improvement in his fatigue with treatment. Discussed treatment plan at length. He was made aware that he would have to take medication at the same time each day. He will call us prior to beginning any new medications both prescription or over-the-counter even herbal medications due to potential drug drug interactions. We will start the process for Harvoni approval and if approved, he will not start medication without instructions from [place] first. He will require labs at this point prior to therapy. Last HCV, genotype from 2012 as previously noted. Previously reported negative HIV testing.   He needs to stop Nexium prior to starting Harvoni. Can take TUMS as needed but not within 4 hours of Harvoni. May take Pepcid 20mg  daily, either take with Harvoni or 12 hours away from [place].

## 2014-01-19 NOTE — Progress Notes (Signed)
Lab order has been faxed to the lab.

## 2014-01-19 NOTE — Progress Notes (Signed)
Please add on HCV RNA PCR quantitative.

## 2014-01-20 NOTE — Progress Notes (Signed)
Please start [facility] referral for harvoni.

## 2014-01-21 ENCOUNTER — Other Ambulatory Visit: Payer: Self-pay | Admitting: Gastroenterology

## 2014-01-21 NOTE — Progress Notes (Signed)
cc'ed to pcp °

## 2014-01-21 NOTE — Progress Notes (Signed)
The paperwork is on your desk. I called [facility] and added on the HCV RNA because they did not draw it at the lab.

## 2014-01-22 LAB — HCV RNA QUANT RFLX ULTRA OR GENOTYP
HCV QUANT LOG: 7.18 {Log} — AB (ref <1.18)
HCV Quantitative: 15286892 IU/mL — ABNORMAL HIGH (ref <15)

## 2014-01-26 LAB — HEPATITIS C GENOTYPE

## 2014-02-03 ENCOUNTER — Telehealth: Payer: Self-pay | Admitting: Gastroenterology

## 2014-02-03 NOTE — Telephone Encounter (Signed)
Tried to call patient regarding Harvoni papers. No answer.

## 2014-02-05 ENCOUNTER — Encounter: Payer: Self-pay | Admitting: Gastroenterology

## 2014-02-08 NOTE — Progress Notes (Signed)
Quick Note:  Awaiting approval for Harvoni from Medicaid. Patient aware of results. ______

## 2014-02-08 NOTE — Telephone Encounter (Signed)
Spoke with patient. Papers completed.

## 2014-02-18 ENCOUNTER — Telehealth: Payer: Self-pay | Admitting: *Deleted

## 2014-02-18 NOTE — Telephone Encounter (Signed)
Tried to call pt- number is disconnected. Mailed him a letter asking him to call [facility] and gave him the phone number.

## 2014-02-18 NOTE — Telephone Encounter (Signed)
[facility] IS TRYING TO REACH PATIENT, IF HE CALLS HERE HAVE HIM CALL THEM AT [phone] , PATIENT NUMBER IS DISCONNECTED

## 2014-02-24 ENCOUNTER — Telehealth: Payer: Self-pay

## 2014-02-24 ENCOUNTER — Other Ambulatory Visit: Payer: Self-pay

## 2014-02-24 DIAGNOSIS — B182 Chronic viral hepatitis C: Secondary | ICD-10-CM

## 2014-02-24 NOTE — Telephone Encounter (Signed)
Instruction letter and lab orders done.  Erline Levine, please schedule ov. I have a letter for you to write in his office visit date.

## 2014-02-24 NOTE — Telephone Encounter (Signed)
[name] arrived today. Any further instructions for him before I call him to pick it up?

## 2014-02-24 NOTE — Telephone Encounter (Signed)
Tried to call pt- NA and no voicemail. 

## 2014-02-24 NOTE — Telephone Encounter (Signed)
See if we can get a dependable phone number for the patient first and foremost.  1. He can start Harvoni at any time. He must take at the same time every day. (Please stress importance of not missing any doses, if he does he should take as soon as he remembers, do not drop in toilet or lose - each pill is worth a $1000, Medicaid gives one shot only). 2. He needs to stop Nexium 48 hours prior to starting Harvoni. Can take TUMS as needed but not within 4 hours of Harvoni. May take Pepcid 20mg  daily, either take WITH Harvoni or 12 hours away from [place]. 3. Call if needs to start any new medications RX or OTC. 4. Labs in four weeks with OV to follow with ME only. CBC, CMET, HCV RNA quantitative.

## 2014-02-25 NOTE — Telephone Encounter (Signed)
APPOINTMENT MADE °

## 2014-02-25 NOTE — Telephone Encounter (Signed)
Pt is aware rx is here. He will come to the office tomorrow before we close and I will go over his instructions with him then.

## 2014-02-26 NOTE — Telephone Encounter (Signed)
Pt came by the office and picked up the [place]. I went over all of his instructions with him and he verbalized understanding. He will call if he has any problems.

## 2014-03-22 DIAGNOSIS — B182 Chronic viral hepatitis C: Secondary | ICD-10-CM | POA: Diagnosis not present

## 2014-03-23 ENCOUNTER — Telehealth: Payer: Self-pay | Admitting: Internal Medicine

## 2014-03-23 ENCOUNTER — Ambulatory Visit (INDEPENDENT_AMBULATORY_CARE_PROVIDER_SITE_OTHER): Payer: Medicare Other | Admitting: Gastroenterology

## 2014-03-23 ENCOUNTER — Encounter: Payer: Self-pay | Admitting: Gastroenterology

## 2014-03-23 VITALS — BP 175/89 | HR 51 | Temp 97.3°F | Ht 69.0 in | Wt 142.0 lb

## 2014-03-23 DIAGNOSIS — B182 Chronic viral hepatitis C: Secondary | ICD-10-CM

## 2014-03-23 LAB — COMPREHENSIVE METABOLIC PANEL
ALBUMIN: 4 g/dL (ref 3.5–5.2)
ALK PHOS: 49 U/L (ref 39–117)
ALT: 9 U/L (ref 0–53)
AST: 15 U/L (ref 0–37)
BUN: 13 mg/dL (ref 6–23)
CALCIUM: 9.3 mg/dL (ref 8.4–10.5)
CHLORIDE: 102 meq/L (ref 96–112)
CO2: 24 mEq/L (ref 19–32)
Creat: 0.85 mg/dL (ref 0.50–1.35)
Glucose, Bld: 86 mg/dL (ref 70–99)
POTASSIUM: 4.6 meq/L (ref 3.5–5.3)
SODIUM: 139 meq/L (ref 135–145)
Total Bilirubin: 0.4 mg/dL (ref 0.2–1.2)
Total Protein: 7.6 g/dL (ref 6.0–8.3)

## 2014-03-23 LAB — CBC WITH DIFFERENTIAL/PLATELET
BASOS ABS: 0 10*3/uL (ref 0.0–0.1)
BASOS PCT: 0 % (ref 0–1)
Eosinophils Absolute: 0.4 10*3/uL (ref 0.0–0.7)
Eosinophils Relative: 4 % (ref 0–5)
HCT: 39.3 % (ref 39.0–52.0)
Hemoglobin: 13.1 g/dL (ref 13.0–17.0)
LYMPHS PCT: 31 % (ref 12–46)
Lymphs Abs: 2.8 10*3/uL (ref 0.7–4.0)
MCH: 30.8 pg (ref 26.0–34.0)
MCHC: 33.3 g/dL (ref 30.0–36.0)
MCV: 92.5 fL (ref 78.0–100.0)
Monocytes Absolute: 0.9 10*3/uL (ref 0.1–1.0)
Monocytes Relative: 10 % (ref 3–12)
NEUTROS ABS: 4.9 10*3/uL (ref 1.7–7.7)
NEUTROS PCT: 55 % (ref 43–77)
PLATELETS: 201 10*3/uL (ref 150–400)
RBC: 4.25 MIL/uL (ref 4.22–5.81)
RDW: 13.7 % (ref 11.5–15.5)
WBC: 8.9 10*3/uL (ref 4.0–10.5)

## 2014-03-23 LAB — HEPATITIS C RNA QUANTITATIVE
HCV Quantitative Log: 1.23 {Log} — ABNORMAL HIGH (ref <1.18)
HCV Quantitative: 17 IU/mL — ABNORMAL HIGH (ref <15)

## 2014-03-23 MED ORDER — OMEPRAZOLE 20 MG PO CPDR: DELAYED_RELEASE_CAPSULE | ORAL | Status: DC

## 2014-03-23 MED ORDER — RAMELTEON 8 MG PO TABS: 8.0000 mg | ORAL_TABLET | Freq: Every day | ORAL | Status: DC

## 2014-03-23 NOTE — Telephone Encounter (Signed)
Pt was seen today and verified all his information as correct when he was being checked it. Lisa from [employer] called this afternoon trying to make contact with patient or his son. Both numbers are disconnected. She said that [employer] attempted delivery of his Harvoni but no one was home to sign for it.  I told her from looking at patient's notes his phone has been disconnect for a month or so.  Please call LIsa back at [phone] ext [phone]

## 2014-03-23 NOTE — Patient Instructions (Signed)
1. Take omeprazole once daily WITH HARVONI. Take both at least 30 minutes before a meal. 2. I sent RX for Rozerem to help you sleep. We may have to fill out paperwork to get approved. In the meantime, you can try Benadryl 25-50mg , 30 minutes before bedtime. Start with 25mg  first to see if it is effective. May work up to 50mg  if needed. 3. Please go and have your blood pressure checked by your PCP today and call if new RX provided. 4. I have provided you with another patient handout for Harvoni. You may consider using nurse line or support line if you have questions or concerns. We are also available as you need [place]. Don't hesitate to call with questions or concerns. As you will see in the handout, Headache, Insomnia, and Fatigue are the most common side effects of Harvoni. It is very important to eat frequently, drink plenty of fluid, and nap as needed. 5. We will call with your results in a day or two and touch base with you.  6. You will need to call us weekly to let us know now you are doing. 7. Office visit in 4 weeks.

## 2014-03-23 NOTE — Progress Notes (Signed)
Primary Care Physician: Deloria Lair, MD  Primary Gastroenterologist:  Garfield Cornea, MD   Chief Complaint  Patient presents with  . Follow-up    Hep-C    HPI: Mathew Grant is a 63 y.o. male here for 4 week follow-up. Started Harvoni 02/26/14. Initially developed a lot of diarrhea but this resolved. Notes he has had some issues with his BP being elevated, supposed to go back to PCP today for recheck. There is discussion of changing his BP meds. Advised patient to let us know of any medication changes. He has had rash on the lower extremities, itchy. 50% better at this time. Has been using benadryl cream. ?flea bites. C/o headaches, insomnia since Harvoni, which are known side effects. Insomnia taking a toll. More difficult managing his anxiety. Denies suicidal ideation. No weight loss. No abdominal pain. No etoh. No melena, brbpr. Has been compliant with medication. C/o chest tight at rest. No SOB/DOE/exertional pain. Symptoms noted prior to [place]. No typical heartburn. No longer on Nexium.  Patient wants to push through the symptoms and continue Harvoni.    Current Outpatient Prescriptions  Medication Sig Dispense Refill  . atenolol (TENORMIN) 100 MG tablet Take 100 mg by mouth at bedtime.    Marland Kitchen HARVONI 90-400 MG TABS Take 1 capsule by mouth daily.     Marland Kitchen oxyCODONE-acetaminophen (PERCOCET) 7.5-325 MG per tablet Take 1-2 tablets by mouth every 4 (four) hours as needed. 50 tablet 0  . triamterene-hydrochlorothiazide (MAXZIDE-25) 37.5-25 MG per tablet Take 1 tablet by mouth daily.      No current facility-administered medications for this visit.    Allergies as of 03/23/2014  . (No Known Allergies)    ROS:  General: Negative for anorexia, weight loss, fever, chills, fatigue, weakness. ENT: Negative for hoarseness, difficulty swallowing , nasal congestion. CV: Negative for chest pain, angina, palpitations, dyspnea on exertion, peripheral edema.  Respiratory: Negative for  dyspnea at rest, dyspnea on exertion, cough, sputum, wheezing.  GI: See history of present illness. GU:  Negative for dysuria, hematuria, urinary incontinence, urinary frequency, nocturnal urination.  Endo: Negative for unusual weight change.    Physical Examination:   BP 184/90 mmHg  Pulse 52  Temp(Src) 97.3 F (36.3 C) (Oral)  Ht 5\' 9"  (1.753 m)  Wt 142 lb (64.411 kg)  BMI 20.96 kg/m2  General: Well-nourished, well-developed in no acute distress.  Eyes: No icterus. Mouth: Oropharyngeal mucosa moist and pink , no lesions erythema or exudate. Lungs: Clear to auscultation bilaterally.  Heart: Regular rate and rhythm, no murmurs rubs or gallops.  Abdomen: Bowel sounds are normal, nontender, nondistended, no hepatosplenomegaly or masses, no abdominal bruits or hernia , no rebound or guarding.   Extremities: No lower extremity edema. No clubbing or deformities. Neuro: Alert and oriented x 4   Skin: Warm and dry, no jaundice.  Rash vs bites noted in lower extremities, partially healed. Psych: Alert and cooperative, normal mood and affect.  Labs:  Lab Results  Component Value Date   WBC 8.9 03/22/2014   HGB 13.1 03/22/2014   HCT 39.3 03/22/2014   MCV 92.5 03/22/2014   PLT 201 03/22/2014   Lab Results  Component Value Date   ALT 19 01/19/2014   AST 23 01/19/2014   ALKPHOS 46 01/19/2014   BILITOT 0.7 01/19/2014   Lab Results  Component Value Date   CREATININE 0.96 01/19/2014   CREATININE 0.96 01/19/2014   BUN 10 01/19/2014   BUN 10 01/19/2014  NA 138 01/19/2014   NA 138 01/19/2014   K 4.6 01/19/2014   K 4.6 01/19/2014   CL 103 01/19/2014   CL 103 01/19/2014   CO2 30 01/19/2014   CO2 30 01/19/2014     Imaging Studies: No results found.

## 2014-03-24 NOTE — Progress Notes (Signed)
cc'ed to pcp °

## 2014-03-24 NOTE — Assessment & Plan Note (Signed)
Almost done with 4th week of Harvoni and having side effects including insomnia, headache, fatigue. Biggest concern of insomnia. Trial of Rozerem. If not covered by insurance, then will start on benadryl to see if effective. Encouraged increased fluid intake to help with headache, nap when possible but not near bedtime. If he feels symptoms are worsening, we can discuss other options or need to stop Harvoni (patient is not interested in at this time). He did note some issues with anxiety/depression but no suicidal ideation. Symptoms likely worsened with sleep deprivation. Plan on weekly or biweekly telephone contact with patient. 24/7 nurse line/support line for Harvoni provided to patient as well. OV in 4 weeks for follow up. Patient instructed to follow up with PCP today for HTN (already scheduled for this), call for any change in medications. Start omeprazole 20mg  daily on empty stomach with the Harvoni. If develops exertional pain, DOE/SOB he should go to ER. If chest tightness at rest continues, then consider further w/u.

## 2014-03-25 NOTE — Progress Notes (Signed)
Quick Note:  Spoke to patient. Discussed all results. HCV RNA 17, had test done just a little over 3 weeks in to [facility] so this is excellent response (down from 15 million). Patient will try to call pharmacy tonite about his Rozerem for sleep and let me know if any issues in getting it covered.  Expect weekly calls until next OV. ______

## 2014-03-25 NOTE — Telephone Encounter (Signed)
Spoke with patient today about his test results. His phone was out of minutes for less than 24 hours. Working now. New number for son put in system by me today.   Patient received his Harvoni yesterday.

## 2014-04-05 ENCOUNTER — Telehealth: Payer: Self-pay | Admitting: Gastroenterology

## 2014-04-05 NOTE — Telephone Encounter (Signed)
Did he get his Rozerem filled?

## 2014-04-05 NOTE — Telephone Encounter (Signed)
Please touch base with patient in the next 1-2 days and see how he is doing on the [place]. He had been having lot of headache and insomnia.

## 2014-04-05 NOTE — Telephone Encounter (Signed)
I spoke with the pt. He said he still has headaches everyday that last an hour or two and then go away. He is not sleeping well, he rests but he said his mind doesn't "shut down" and he has a lot of fatigue.

## 2014-04-06 NOTE — Telephone Encounter (Signed)
I spoke with [place]- pt did not pick up because it is not covered by his insurance

## 2014-04-07 NOTE — Telephone Encounter (Signed)
Can we find out what Medicaid covers? ie Ambien or another insomnia med.

## 2014-04-13 NOTE — Telephone Encounter (Signed)
[place] medicaid covers: zolpidem 5mg , temazepam 15mg  or 30mg , triazolam 0.125mg  or 0.25mg , flurazepam 15mg , or estazolam 1mg  or 2mg - per the formulary

## 2014-04-14 MED ORDER — ZOLPIDEM TARTRATE 5 MG PO TABS: 5.0000 mg | ORAL_TABLET | Freq: Every evening | ORAL | Status: DC | PRN

## 2014-04-14 NOTE — Telephone Encounter (Signed)
Tried to call pt- NA-no voicemail.  

## 2014-04-14 NOTE — Telephone Encounter (Signed)
Please check on patient this week. We can offer Ambien 5mg  at bedtime prn insomnia. Needs to take at least 8 hours before needing to be awake. RX sent.   Keep OV as planned.

## 2014-04-20 NOTE — Telephone Encounter (Signed)
Tried to call pt- NA no voicemail  Also received call from [facility] that they cannot reach pt either about his harvoni.

## 2014-04-21 NOTE — Telephone Encounter (Signed)
Mailed letter to pt to call the office. I also informed [facility] that we havent been able to reach him either.

## 2014-04-22 ENCOUNTER — Ambulatory Visit (INDEPENDENT_AMBULATORY_CARE_PROVIDER_SITE_OTHER): Payer: Medicare Other | Admitting: Gastroenterology

## 2014-04-22 ENCOUNTER — Encounter: Payer: Self-pay | Admitting: Gastroenterology

## 2014-04-22 VITALS — BP 131/78 | HR 58 | Temp 97.9°F | Ht 69.0 in | Wt 146.0 lb

## 2014-04-22 DIAGNOSIS — G4700 Insomnia, unspecified: Secondary | ICD-10-CM | POA: Diagnosis not present

## 2014-04-22 DIAGNOSIS — B182 Chronic viral hepatitis C: Secondary | ICD-10-CM | POA: Diagnosis not present

## 2014-04-22 NOTE — Progress Notes (Signed)
cc'ed to pcp °

## 2014-04-22 NOTE — Patient Instructions (Signed)
1. Please go get your labs done today. 2. Pick up your ambien and take first dose tonight. You need to take at least 8 hours prior to time you need to be awake in the morning. 3. We will touch base with you tomorrow to see if you want to continue Harvoni.

## 2014-04-22 NOTE — Assessment & Plan Note (Signed)
63 year old gentleman with history of chronic hepatitis C, nave to treatment. Liver biopsy with inflammatory grade 2, fibrosis staging 2. Continues to have significant insomnia and headache related to [place]. Patient tried taking medication earlier in the day but still could not sleep. He is contemplating stopping the medication due to severity of side effects. We discussed possibility of relapse if 12 week course is not completed. We also discussed that he has stage 2 fibrosis prior to treatment which is somewhat of a positive indicator to his ability to cope with chronic HCV.   Knowing he can try Ambien for insomnia, he has decided to try for a couple more days before making final decision to stop medication. We will touch base tomorrow to see how he does over night. He needs to make a decision in order to have last month shipped to him. Decision needs to be made by Monday. Patient aware of plan as outlined and in agreement.   Due for labs today.

## 2014-04-22 NOTE — Telephone Encounter (Signed)
Pt was seen in the office today. rx was called into the pharmacy and he has a $0 copay. LSL has informed pt.

## 2014-04-22 NOTE — Progress Notes (Signed)
      Primary Care Physician: Deloria Lair, MD  Primary Gastroenterologist:  Garfield Cornea, MD   Chief Complaint  Patient presents with  . Follow-up    HPI: Mathew Grant is a 63 y.o. male here for follow up of Hep C currently completing 8th week of Harvoni. He has finally gotten his blood pressure under control. He continues to have headache and insomnia likely related to [place]. Benadryl did not help him sleep. Rozerem was denied by insurance. We tried to contact patient for over one week to offer RX for Ambien but could not reach him. He has no voicemail and his son has his phone. Patient has 3 days of Harvoni left before he is out. He is thinking of quitting due to fatigue and insomnia. Has slept well only one night in the past 8 weeks and that was the evening he forgot to take his medication. Denies abdominal pain. Appetite is good. Bowel movements regular. No further rash. No melena rectal bleeding.   Current Outpatient Prescriptions  Medication Sig Dispense Refill  . atenolol (TENORMIN) 100 MG tablet Take 100 mg by mouth at bedtime.    Marland Kitchen HARVONI 90-400 MG TABS Take 1 capsule by mouth daily.     Marland Kitchen omeprazole (PRILOSEC) 20 MG capsule 20mg  orally daily with Harvoni (take both 30 minutes before a meal). 30 capsule 1  . OxyCODONE HCl 7.5 MG TABA Take 7.5 mg by mouth every 6 (six) hours as needed.     . triamterene-hydrochlorothiazide (MAXZIDE-25) 37.5-25 MG per tablet Take by mouth daily.     Marland Kitchen zolpidem (AMBIEN) 5 MG tablet Take 1 tablet (5 mg total) by mouth at bedtime as needed for sleep. Take at least 8 hours prior to needing to be awake. (Patient not taking: Reported on 04/22/2014) 30 tablet 0   No current facility-administered medications for this visit.    Allergies as of 04/22/2014  . (No Known Allergies)    ROS:  General: Negative for anorexia, weight loss, fever, chills, fatigue, weakness. ENT: Negative for hoarseness, difficulty swallowing , nasal congestion. CV:  Negative for chest pain, angina, palpitations, dyspnea on exertion, peripheral edema.  Respiratory: Negative for dyspnea at rest, dyspnea on exertion, cough, sputum, wheezing.  GI: See history of present illness. GU:  Negative for dysuria, hematuria, urinary incontinence, urinary frequency, nocturnal urination.  Endo: Negative for unusual weight change.    Physical Examination:   BP 131/78 mmHg  Pulse 58  Temp(Src) 97.9 F (36.6 C)  Ht 5\' 9"  (1.753 m)  Wt 146 lb (66.225 kg)  BMI 21.55 kg/m2  General: Well-nourished, well-developed in no acute distress.  Eyes: No icterus. Mouth: Oropharyngeal mucosa moist and pink , no lesions erythema or exudate. Lungs: Clear to auscultation bilaterally.  Heart: Regular rate and rhythm, no murmurs rubs or gallops.  Abdomen: Bowel sounds are normal, nontender, nondistended, no hepatosplenomegaly or masses, no abdominal bruits or hernia , no rebound or guarding.   Extremities: No lower extremity edema. No clubbing or deformities. Neuro: Alert and oriented x 4   Skin: Warm and dry, no jaundice.   Psych: Alert and cooperative, normal mood and affect.

## 2014-04-23 LAB — BASIC METABOLIC PANEL
BUN: 12 mg/dL (ref 6–23)
CALCIUM: 9.7 mg/dL (ref 8.4–10.5)
CO2: 27 mEq/L (ref 19–32)
Chloride: 100 mEq/L (ref 96–112)
Creat: 0.92 mg/dL (ref 0.50–1.35)
GLUCOSE: 88 mg/dL (ref 70–99)
Potassium: 4.3 mEq/L (ref 3.5–5.3)
Sodium: 137 mEq/L (ref 135–145)

## 2014-04-23 LAB — CBC WITH DIFFERENTIAL/PLATELET
BASOS PCT: 0 % (ref 0–1)
Basophils Absolute: 0 10*3/uL (ref 0.0–0.1)
EOS PCT: 5 % (ref 0–5)
Eosinophils Absolute: 0.5 10*3/uL (ref 0.0–0.7)
HCT: 37.5 % — ABNORMAL LOW (ref 39.0–52.0)
Hemoglobin: 13 g/dL (ref 13.0–17.0)
LYMPHS PCT: 26 % (ref 12–46)
Lymphs Abs: 2.3 10*3/uL (ref 0.7–4.0)
MCH: 31.2 pg (ref 26.0–34.0)
MCHC: 34.7 g/dL (ref 30.0–36.0)
MCV: 89.9 fL (ref 78.0–100.0)
MONO ABS: 1 10*3/uL (ref 0.1–1.0)
MPV: 10.6 fL (ref 9.4–12.4)
Monocytes Relative: 11 % (ref 3–12)
Neutro Abs: 5.2 10*3/uL (ref 1.7–7.7)
Neutrophils Relative %: 58 % (ref 43–77)
PLATELETS: 215 10*3/uL (ref 150–400)
RBC: 4.17 MIL/uL — ABNORMAL LOW (ref 4.22–5.81)
RDW: 13.7 % (ref 11.5–15.5)
WBC: 9 10*3/uL (ref 4.0–10.5)

## 2014-04-23 LAB — HEPATIC FUNCTION PANEL
AST: 14 U/L (ref 0–37)
Albumin: 4.1 g/dL (ref 3.5–5.2)
Alkaline Phosphatase: 49 U/L (ref 39–117)
BILIRUBIN DIRECT: 0.1 mg/dL (ref 0.0–0.3)
BILIRUBIN INDIRECT: 0.2 mg/dL (ref 0.2–1.2)
Total Bilirubin: 0.3 mg/dL (ref 0.2–1.2)
Total Protein: 8 g/dL (ref 6.0–8.3)

## 2014-04-23 NOTE — Progress Notes (Signed)
Quick Note:  Labs look good but HCV RNA still pending. I tried to call patient but his phone is out of service again. Told me yesterday that it was working again. Had planned to discuss if patient had better night with ambien and was going to stay on Harvoni.  Let's try to get in touch with patient on Monday. ______

## 2014-04-26 ENCOUNTER — Telehealth: Payer: Self-pay | Admitting: Gastroenterology

## 2014-04-26 LAB — HEPATITIS C RNA QUANTITATIVE: HCV Quantitative: NOT DETECTED [IU]/mL

## 2014-04-26 NOTE — Telephone Encounter (Signed)
Tried to call pt- NA and no voicemail. 

## 2014-04-26 NOTE — Telephone Encounter (Signed)
Please touch base with patient today. I spoke with him Friday and he was going to continue Harvoni for couple of more days and try the [place]. We need to find out if he decided to continue Harvoni and if so he needs more meds ASAP.   Unfortunately his HCV RNA level is still not back.

## 2014-04-27 NOTE — Telephone Encounter (Signed)
Please continue to try and get in touch with patient today. Let him know his HCV RNA is negative. We need to know if he plans to complete the last 4 weeks of therapy.

## 2014-04-27 NOTE — Telephone Encounter (Signed)
Tried to call pt- NA and no voicemail. 

## 2014-04-28 NOTE — Telephone Encounter (Signed)
Tried to call pt- NA and no voicemail. 

## 2014-04-29 NOTE — Telephone Encounter (Signed)
Tried to call pt- NA and no voicemail. 

## 2014-04-30 NOTE — Telephone Encounter (Signed)
Unable to reach pt- mailed letter.  

## 2014-05-03 NOTE — Progress Notes (Signed)
Quick Note:  I tried to call pt. VM not set up. He should be getting the letter that Almyra Free mailed on 04/30/2014. ______

## 2014-08-24 DIAGNOSIS — M65811 Other synovitis and tenosynovitis, right shoulder: Secondary | ICD-10-CM | POA: Diagnosis not present

## 2014-08-24 DIAGNOSIS — Z8546 Personal history of malignant neoplasm of prostate: Secondary | ICD-10-CM | POA: Diagnosis not present

## 2014-08-24 DIAGNOSIS — Z79899 Other long term (current) drug therapy: Secondary | ICD-10-CM | POA: Diagnosis not present

## 2014-08-24 DIAGNOSIS — F17200 Nicotine dependence, unspecified, uncomplicated: Secondary | ICD-10-CM | POA: Diagnosis not present

## 2014-08-24 DIAGNOSIS — M7591 Shoulder lesion, unspecified, right shoulder: Secondary | ICD-10-CM | POA: Diagnosis not present

## 2014-08-24 DIAGNOSIS — M7021 Olecranon bursitis, right elbow: Secondary | ICD-10-CM | POA: Diagnosis not present

## 2014-08-24 DIAGNOSIS — Z8673 Personal history of transient ischemic attack (TIA), and cerebral infarction without residual deficits: Secondary | ICD-10-CM | POA: Diagnosis not present

## 2014-08-24 DIAGNOSIS — Z8619 Personal history of other infectious and parasitic diseases: Secondary | ICD-10-CM | POA: Diagnosis not present

## 2014-08-24 DIAGNOSIS — Z79891 Long term (current) use of opiate analgesic: Secondary | ICD-10-CM | POA: Diagnosis not present

## 2014-09-03 DIAGNOSIS — M7551 Bursitis of right shoulder: Secondary | ICD-10-CM | POA: Diagnosis not present

## 2014-09-03 DIAGNOSIS — M25521 Pain in right elbow: Secondary | ICD-10-CM | POA: Diagnosis not present

## 2014-09-03 DIAGNOSIS — M25511 Pain in right shoulder: Secondary | ICD-10-CM | POA: Diagnosis not present

## 2014-09-03 DIAGNOSIS — M7021 Olecranon bursitis, right elbow: Secondary | ICD-10-CM | POA: Diagnosis not present

## 2015-05-10 DIAGNOSIS — B1920 Unspecified viral hepatitis C without hepatic coma: Secondary | ICD-10-CM | POA: Diagnosis not present

## 2015-05-10 DIAGNOSIS — C61 Malignant neoplasm of prostate: Secondary | ICD-10-CM | POA: Diagnosis not present

## 2015-05-10 DIAGNOSIS — I10 Essential (primary) hypertension: Secondary | ICD-10-CM | POA: Diagnosis not present

## 2015-05-10 DIAGNOSIS — R5383 Other fatigue: Secondary | ICD-10-CM | POA: Diagnosis not present

## 2016-01-10 DIAGNOSIS — Z6820 Body mass index (BMI) 20.0-20.9, adult: Secondary | ICD-10-CM | POA: Diagnosis not present

## 2016-01-10 DIAGNOSIS — I10 Essential (primary) hypertension: Secondary | ICD-10-CM | POA: Diagnosis not present

## 2016-01-10 DIAGNOSIS — R5382 Chronic fatigue, unspecified: Secondary | ICD-10-CM | POA: Diagnosis not present

## 2016-08-13 DIAGNOSIS — C61 Malignant neoplasm of prostate: Secondary | ICD-10-CM | POA: Diagnosis not present

## 2016-08-13 DIAGNOSIS — I10 Essential (primary) hypertension: Secondary | ICD-10-CM | POA: Diagnosis not present

## 2016-08-13 DIAGNOSIS — R5383 Other fatigue: Secondary | ICD-10-CM | POA: Diagnosis not present

## 2017-03-20 DIAGNOSIS — L080 Pyoderma: Secondary | ICD-10-CM | POA: Diagnosis not present

## 2017-03-20 DIAGNOSIS — I10 Essential (primary) hypertension: Secondary | ICD-10-CM | POA: Diagnosis not present

## 2017-05-02 DIAGNOSIS — F17200 Nicotine dependence, unspecified, uncomplicated: Secondary | ICD-10-CM | POA: Diagnosis not present

## 2017-05-02 DIAGNOSIS — G8929 Other chronic pain: Secondary | ICD-10-CM | POA: Diagnosis not present

## 2017-05-02 DIAGNOSIS — I10 Essential (primary) hypertension: Secondary | ICD-10-CM | POA: Diagnosis not present

## 2017-05-02 DIAGNOSIS — M25511 Pain in right shoulder: Secondary | ICD-10-CM | POA: Diagnosis not present

## 2021-01-06 ENCOUNTER — Other Ambulatory Visit: Payer: Self-pay | Admitting: Internal Medicine

## 2021-01-06 DIAGNOSIS — Z87891 Personal history of nicotine dependence: Secondary | ICD-10-CM

## 2021-02-15 ENCOUNTER — Other Ambulatory Visit: Payer: Self-pay

## 2021-02-15 ENCOUNTER — Emergency Department (HOSPITAL_COMMUNITY)
Admission: EM | Admit: 2021-02-15 | Discharge: 2021-02-15 | Disposition: A | Discharge status: Home / Self Care | Payer: Medicare HMO | Attending: Emergency Medicine | Admitting: Emergency Medicine

## 2021-02-15 ENCOUNTER — Encounter (HOSPITAL_COMMUNITY): Payer: Self-pay

## 2021-02-15 DIAGNOSIS — K4090 Unilateral inguinal hernia, without obstruction or gangrene, not specified as recurrent: Secondary | ICD-10-CM | POA: Diagnosis present

## 2021-02-15 DIAGNOSIS — M79605 Pain in left leg: Secondary | ICD-10-CM | POA: Insufficient documentation

## 2021-02-15 DIAGNOSIS — Z8546 Personal history of malignant neoplasm of prostate: Secondary | ICD-10-CM | POA: Diagnosis not present

## 2021-02-15 DIAGNOSIS — M25552 Pain in left hip: Secondary | ICD-10-CM | POA: Insufficient documentation

## 2021-02-15 DIAGNOSIS — F17210 Nicotine dependence, cigarettes, uncomplicated: Secondary | ICD-10-CM | POA: Insufficient documentation

## 2021-02-15 DIAGNOSIS — Z79899 Other long term (current) drug therapy: Secondary | ICD-10-CM | POA: Insufficient documentation

## 2021-02-15 DIAGNOSIS — I10 Essential (primary) hypertension: Secondary | ICD-10-CM | POA: Insufficient documentation

## 2021-02-15 LAB — CBC WITH DIFFERENTIAL/PLATELET
Abs Immature Granulocytes: 0.02 10*3/uL (ref 0.00–0.07)
Basophils Absolute: 0.1 10*3/uL (ref 0.0–0.1)
Basophils Relative: 1 %
Eosinophils Absolute: 0.2 10*3/uL (ref 0.0–0.5)
Eosinophils Relative: 2 %
HCT: 36.2 % — ABNORMAL LOW (ref 39.0–52.0)
Hemoglobin: 12.1 g/dL — ABNORMAL LOW (ref 13.0–17.0)
Immature Granulocytes: 0 %
Lymphocytes Relative: 19 %
Lymphs Abs: 1.7 10*3/uL (ref 0.7–4.0)
MCH: 31.8 pg (ref 26.0–34.0)
MCHC: 33.4 g/dL (ref 30.0–36.0)
MCV: 95 fL (ref 80.0–100.0)
Monocytes Absolute: 0.6 10*3/uL (ref 0.1–1.0)
Monocytes Relative: 7 %
Neutro Abs: 6.2 10*3/uL (ref 1.7–7.7)
Neutrophils Relative %: 71 %
Platelets: 220 10*3/uL (ref 150–400)
RBC: 3.81 MIL/uL — ABNORMAL LOW (ref 4.22–5.81)
RDW: 13 % (ref 11.5–15.5)
WBC: 8.7 10*3/uL (ref 4.0–10.5)
nRBC: 0 % (ref 0.0–0.2)

## 2021-02-15 LAB — COMPREHENSIVE METABOLIC PANEL
ALT: 11 U/L (ref 0–44)
AST: 17 U/L (ref 15–41)
Albumin: 4.6 g/dL (ref 3.5–5.0)
Alkaline Phosphatase: 46 U/L (ref 38–126)
Anion gap: 8 (ref 5–15)
BUN: 16 mg/dL (ref 8–23)
CO2: 28 mmol/L (ref 22–32)
Calcium: 9.3 mg/dL (ref 8.9–10.3)
Chloride: 97 mmol/L — ABNORMAL LOW (ref 98–111)
Creatinine, Ser: 1.42 mg/dL — ABNORMAL HIGH (ref 0.61–1.24)
GFR, Estimated: 53 mL/min — ABNORMAL LOW (ref >60)
Glucose, Bld: 108 mg/dL — ABNORMAL HIGH (ref 70–99)
Potassium: 3.9 mmol/L (ref 3.5–5.1)
Sodium: 133 mmol/L — ABNORMAL LOW (ref 135–145)
Total Bilirubin: 0.6 mg/dL (ref 0.3–1.2)
Total Protein: 8.1 g/dL (ref 6.5–8.1)

## 2021-02-15 LAB — LIPASE, BLOOD: Lipase: 22 U/L (ref 11–51)
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
## 2021-02-15 NOTE — Discharge Instructions (Addendum)
Make an appointment to follow-up with general surgery for evaluation of the left inguinal hernia.  Return for any vomiting fevers or worse pain.

## 2021-02-15 NOTE — ED Provider Notes (Signed)
Emergency Medicine Provider Triage Evaluation Note  Mathew Grant , a 70 y.o. male  was evaluated in triage.  Pt complains of left inguinal hernia pain.  Started hurting about a week ago, it is reducible.  Patient states it became severely painful this morning, the pain also radiates to his back which is new.  Concerned that the hernia is because the back pain..  Review of Systems  Positive: Left inguinal pain, hernia, back pain Negative: Nausea, vomiting  Physical Exam  BP (!) 151/83 (BP Location: Right Arm)   Pulse (!) 59   Temp 98.5 F (36.9 C)   Resp 18   Ht 5\' 8"  (1.727 m)   Wt 61.2 kg   SpO2 100%   BMI 20.53 kg/m  Gen:   Awake, no distress   Resp:  Normal effort  MSK:   Moves extremities without difficulty  Other:  Abdomen is soft and nontender, inguinal hernia exam deferred till in the room.      Medical Decision Making  Medically screening exam initiated at 5:37 PM.  Appropriate orders placed.  Mathew Grant was informed that the remainder of the evaluation will be completed by another provider, this initial triage assessment does not replace that evaluation, and the importance of remaining in the ED until their evaluation is complete.  We will start with labs, imaging decision deferred until after examination of the hernia.   Sherrill Raring, PA-C 02/15/21 1739    Fredia Sorrow, MD 02/21/21 1536

## 2021-02-15 NOTE — ED Triage Notes (Signed)
Left inguinal hernia x 1 week, increased pain x 3 days, states weak and sore throughout left leg. Ambulatory to triage

## 2021-02-28 ENCOUNTER — Encounter: Payer: Self-pay | Admitting: General Surgery

## 2021-02-28 ENCOUNTER — Ambulatory Visit (INDEPENDENT_AMBULATORY_CARE_PROVIDER_SITE_OTHER): Payer: Medicare HMO | Admitting: General Surgery

## 2021-02-28 ENCOUNTER — Other Ambulatory Visit: Payer: Self-pay

## 2021-02-28 VITALS — BP 177/92 | HR 50 | Temp 98.2°F | Resp 12 | Ht 68.0 in | Wt 134.0 lb

## 2021-02-28 DIAGNOSIS — K4090 Unilateral inguinal hernia, without obstruction or gangrene, not specified as recurrent: Secondary | ICD-10-CM
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
## 2021-03-07 ENCOUNTER — Ambulatory Visit: Payer: Medicare HMO | Admitting: General Surgery

## 2021-03-09 ENCOUNTER — Ambulatory Visit: Payer: Medicare HMO | Admitting: General Surgery

## 2021-03-10 NOTE — Patient Instructions (Signed)
Mathew Grant  03/10/2021     @PREFPERIOPPHARMACY @   Your procedure is scheduled on 03/15/2021.   Report to [facility] at  [place] A.M.   Call this number if you have problems the morning of surgery:  [phone]   Remember:  Do not eat or drink after midnight.      Take these medicines the morning of surgery with A SIP OF WATER                 atenolol, baclofen, oxycodone (if needed).     Do not wear jewelry, make-up or nail polish.  Do not wear lotions, powders, or perfumes, or deodorant.  Do not shave 48 hours prior to surgery.  Men may shave face and neck.  Do not bring valuables to the hospital.  [facility] is not responsible for any belongings or valuables.  Contacts, dentures or bridgework may not be worn into surgery.  Leave your suitcase in the car.  After surgery it may be brought to your room.  For patients admitted to the hospital, discharge time will be determined by your treatment team.  Patients discharged the day of surgery will not be allowed to drive home and must have someone with them for 24 hours.    Special instructions:   DO NOT smoke tobacco or vape for 24 hours before your procedure.  Please read over the following fact sheets that you were given. Coughing and Deep Breathing, Surgical Site Infection Prevention, Anesthesia Post-op Instructions, and Care and Recovery After Surgery      Open Hernia Repair, Adult, Care After What can I expect after the procedure? After the procedure, it is common to have: Mild discomfort. Slight bruising. Mild swelling. Pain in the belly (abdomen). A small amount of blood from the cut from surgery (incision). Follow these instructions at home: Your doctor may give you more specific instructions. If you have problems, call your doctor. Medicines Take over-the-counter and prescription medicines only as told by your doctor. If told, take steps to prevent problems with pooping (constipation). You  may need to: Drink enough fluid to keep your pee (urine) pale yellow. Take medicines. You will be told what medicines to take. Eat foods that are high in fiber. These include beans, whole grains, and fresh fruits and vegetables. Limit foods that are high in fat and sugar. These include fried or sweet foods. Ask your doctor if you should avoid driving or using machines while you are taking your medicine. Incision care  Follow instructions from your doctor about how to take care of your incision. Make sure you: Wash your hands with soap and water for at least 20 seconds before and after you change your bandage (dressing). If you cannot use soap and water, use hand sanitizer. Change your bandage. Leave stitches or skin glue in place for at least 2 weeks. Leave tape strips alone unless you are told to take them off. You may trim the edges of the tape strips if they curl up. Check your incision every day for signs of infection. Check for: More redness, swelling, or pain. More fluid or blood. Warmth. Pus or a bad smell. Wear loose, soft clothing while your incision heals. Activity  Rest as told by your doctor. Do not lift anything that is heavier than 10 lb (4.5 kg), or the limit that you are told. Do not play contact sports until your doctor says that this is safe. If  you were given a sedative during your procedure, do not drive or use machines until your doctor says that it is safe. A sedative is a medicine that helps you relax. Return to your normal activities when your doctor says that it is safe. General instructions Do not take baths, swim, or use a hot tub. Ask your doctor about taking showers or sponge baths. Hold a pillow over your belly when you cough or sneeze. This helps with pain. Do not smoke or use any products that contain nicotine or tobacco. If you need help quitting, ask your doctor. Keep all follow-up visits. Contact a doctor if: You have any of these signs of infection  in or around your incision: More redness, swelling, or pain. More fluid or blood. Warmth. Pus. A bad smell. You have a fever or chills. You have blood in your poop (stool). You have not pooped (had a bowel movement) in 2-3 days. Medicine does not help your pain. Get help right away if: You have chest pain, or you are short of breath. You feel faint or light-headed. You have very bad pain. You vomit and your pain is worse. You have pain, swelling, or redness in a leg. These symptoms may be an emergency. Get help right away. Call your local emergency services (911 in the [place] Do not wait to see if the symptoms will go away. Do not drive yourself to the hospital. Summary After this procedure, it is common to have mild discomfort, slight bruising, and mild swelling. Follow instructions from your doctor about how to take care of your cut from surgery (incision). Check every day for signs of infection. Do not lift heavy objects or play contact sports until your doctor says it is safe. Return to your normal activities as told by your doctor. This information is not intended to replace advice given to you by your health care provider. Make sure you discuss any questions you have with your health care provider. Document Revised: 12/14/2019 Document Reviewed: 12/14/2019 Elsevier Patient Education  2022 [place] Anesthesia, Adult, Care After This sheet gives you information about how to care for yourself after your procedure. Your health care provider may also give you more specific instructions. If you have problems or questions, contact your health care provider. What can I expect after the procedure? After the procedure, the following side effects are common: Pain or discomfort at the IV site. Nausea. Vomiting. Sore throat. Trouble concentrating. Feeling cold or chills. Feeling weak or tired. Sleepiness and fatigue. Soreness and body aches. These side effects can affect  parts of the body that were not involved in surgery. Follow these instructions at home: For the time period you were told by your health care provider:  Rest. Do not participate in activities where you could fall or become injured. Do not drive or use machinery. Do not drink alcohol. Do not take sleeping pills or medicines that cause drowsiness. Do not make important decisions or sign legal documents. Do not take care of children on your own. Eating and drinking Follow any instructions from your health care provider about eating or drinking restrictions. When you feel hungry, start by eating small amounts of foods that are soft and easy to digest (bland), such as toast. Gradually return to your regular diet. Drink enough fluid to keep your urine pale yellow. If you vomit, rehydrate by drinking water, juice, or clear broth. General instructions If you have sleep apnea, surgery and certain medicines can increase your risk  for breathing problems. Follow instructions from your health care provider about wearing your sleep device: Anytime you are sleeping, including during daytime naps. While taking prescription pain medicines, sleeping medicines, or medicines that make you drowsy. Have a responsible adult stay with you for the time you are told. It is important to have someone help care for you until you are awake and alert. Return to your normal activities as told by your health care provider. Ask your health care provider what activities are safe for you. Take over-the-counter and prescription medicines only as told by your health care provider. If you smoke, do not smoke without supervision. Keep all follow-up visits as told by your health care provider. This is important. Contact a health care provider if: You have nausea or vomiting that does not get better with medicine. You cannot eat or drink without vomiting. You have pain that does not get better with medicine. You are unable to  pass urine. You develop a skin rash. You have a fever. You have redness around your IV site that gets worse. Get help right away if: You have difficulty breathing. You have chest pain. You have blood in your urine or stool, or you vomit blood. Summary After the procedure, it is common to have a sore throat or nausea. It is also common to feel tired. Have a responsible adult stay with you for the time you are told. It is important to have someone help care for you until you are awake and alert. When you feel hungry, start by eating small amounts of foods that are soft and easy to digest (bland), such as toast. Gradually return to your regular diet. Drink enough fluid to keep your urine pale yellow. Return to your normal activities as told by your health care provider. Ask your health care provider what activities are safe for you. This information is not intended to replace advice given to you by your health care provider. Make sure you discuss any questions you have with your health care provider. Document Revised: 01/14/2020 Document Reviewed: 08/13/2019 Elsevier Patient Education  2022 [employer]. How to Use Chlorhexidine for Bathing Chlorhexidine gluconate (CHG) is a germ-killing (antiseptic) solution that is used to clean the skin. It can get rid of the bacteria that normally live on the skin and can keep them away for about 24 hours. To clean your skin with CHG, you may be given: A CHG solution to use in the shower or as part of a sponge bath. A prepackaged cloth that contains CHG. Cleaning your skin with CHG may help lower the risk for infection: While you are staying in the intensive care unit of the hospital. If you have a vascular access, such as a central line, to provide short-term or long-term access to your veins. If you have a catheter to drain urine from your bladder. If you are on a ventilator. A ventilator is a machine that helps you breathe by moving air in and out of  your lungs. After surgery. What are the risks? Risks of using CHG include: A skin reaction. Hearing loss, if CHG gets in your ears and you have a perforated eardrum. Eye injury, if CHG gets in your eyes and is not rinsed out. The CHG product catching fire. Make sure that you avoid smoking and flames after applying CHG to your skin. Do not use CHG: If you have a chlorhexidine allergy or have previously reacted to chlorhexidine. On babies younger than 84 months of age. How  to use CHG solution Use CHG only as told by your health care provider, and follow the instructions on the label. Use the full amount of CHG as directed. Usually, this is one bottle. During a shower Follow these steps when using CHG solution during a shower (unless your health care provider gives you different instructions): Start the shower. Use your normal soap and shampoo to wash your face and hair. Turn off the shower or move out of the shower stream. Pour the CHG onto a clean washcloth. Do not use any type of brush or rough-edged sponge. Starting at your neck, lather your body down to your toes. Make sure you follow these instructions: If you will be having surgery, pay special attention to the part of your body where you will be having surgery. Scrub this area for at least 1 minute. Do not use CHG on your head or face. If the solution gets into your ears or eyes, rinse them well with water. Avoid your genital area. Avoid any areas of skin that have broken skin, cuts, or scrapes. Scrub your back and under your arms. Make sure to wash skin folds. Let the lather sit on your skin for 1-2 minutes or as long as told by your health care provider. Thoroughly rinse your entire body in the shower. Make sure that all body creases and crevices are rinsed well. Dry off with a clean towel. Do not put any substances on your body afterward--such as powder, lotion, or perfume--unless you are told to do so by your health care  provider. Only use lotions that are recommended by the manufacturer. Put on clean clothes or pajamas. If it is the night before your surgery, sleep in clean sheets.  During a sponge bath Follow these steps when using CHG solution during a sponge bath (unless your health care provider gives you different instructions): Use your normal soap and shampoo to wash your face and hair. Pour the CHG onto a clean washcloth. Starting at your neck, lather your body down to your toes. Make sure you follow these instructions: If you will be having surgery, pay special attention to the part of your body where you will be having surgery. Scrub this area for at least 1 minute. Do not use CHG on your head or face. If the solution gets into your ears or eyes, rinse them well with water. Avoid your genital area. Avoid any areas of skin that have broken skin, cuts, or scrapes. Scrub your back and under your arms. Make sure to wash skin folds. Let the lather sit on your skin for 1-2 minutes or as long as told by your health care provider. Using a different clean, wet washcloth, thoroughly rinse your entire body. Make sure that all body creases and crevices are rinsed well. Dry off with a clean towel. Do not put any substances on your body afterward--such as powder, lotion, or perfume--unless you are told to do so by your health care provider. Only use lotions that are recommended by the manufacturer. Put on clean clothes or pajamas. If it is the night before your surgery, sleep in clean sheets. How to use CHG prepackaged cloths Only use CHG cloths as told by your health care provider, and follow the instructions on the label. Use the CHG cloth on clean, dry skin. Do not use the CHG cloth on your head or face unless your health care provider tells you to. When washing with the CHG cloth: Avoid your genital area. Avoid any  areas of skin that have broken skin, cuts, or scrapes. Before surgery Follow these steps  when using a CHG cloth to clean before surgery (unless your health care provider gives you different instructions): Using the CHG cloth, vigorously scrub the part of your body where you will be having surgery. Scrub using a back-and-forth motion for 3 minutes. The area on your body should be completely wet with CHG when you are done scrubbing. Do not rinse. Discard the cloth and let the area air-dry. Do not put any substances on the area afterward, such as powder, lotion, or perfume. Put on clean clothes or pajamas. If it is the night before your surgery, sleep in clean sheets.  For general bathing Follow these steps when using CHG cloths for general bathing (unless your health care provider gives you different instructions). Use a separate CHG cloth for each area of your body. Make sure you wash between any folds of skin and between your fingers and toes. Wash your body in the following order, switching to a new cloth after each step: The front of your neck, shoulders, and chest. Both of your arms, under your arms, and your hands. Your stomach and groin area, avoiding the genitals. Your right leg and foot. Your left leg and foot. The back of your neck, your back, and your buttocks. Do not rinse. Discard the cloth and let the area air-dry. Do not put any substances on your body afterward--such as powder, lotion, or perfume--unless you are told to do so by your health care provider. Only use lotions that are recommended by the manufacturer. Put on clean clothes or pajamas. Contact a health care provider if: Your skin gets irritated after scrubbing. You have questions about using your solution or cloth. You swallow any chlorhexidine. Call your local poison control center (1-629-025-0231 in the [place] Get help right away if: Your eyes itch badly, or they become very red or swollen. Your skin itches badly and is red or swollen. Your hearing changes. You have trouble seeing. You have swelling or  tingling in your mouth or throat. You have trouble breathing. These symptoms may represent a serious problem that is an emergency. Do not wait to see if the symptoms will go away. Get medical help right away. Call your local emergency services (911 in the [place] Do not drive yourself to the hospital. Summary Chlorhexidine gluconate (CHG) is a germ-killing (antiseptic) solution that is used to clean the skin. Cleaning your skin with CHG may help to lower your risk for infection. You may be given CHG to use for bathing. It may be in a bottle or in a prepackaged cloth to use on your skin. Carefully follow your health care provider's instructions and the instructions on the product label. Do not use CHG if you have a chlorhexidine allergy. Contact your health care provider if your skin gets irritated after scrubbing. This information is not intended to replace advice given to you by your health care provider. Make sure you discuss any questions you have with your health care provider. Document Revised: 07/11/2020 Document Reviewed: 07/11/2020 Elsevier Patient Education  2022 [employer].

## 2021-03-13 ENCOUNTER — Other Ambulatory Visit: Payer: Self-pay

## 2021-03-13 ENCOUNTER — Encounter (HOSPITAL_COMMUNITY)
Admission: RE | Admit: 2021-03-13 | Discharge: 2021-03-13 | Disposition: A | Discharge status: Home / Self Care | Payer: Medicare HMO | Source: Ambulatory Visit | Attending: General Surgery | Admitting: General Surgery

## 2021-03-13 ENCOUNTER — Encounter (HOSPITAL_COMMUNITY): Payer: Self-pay

## 2021-03-13 DIAGNOSIS — Z01818 Encounter for other preprocedural examination: Secondary | ICD-10-CM | POA: Diagnosis present

## 2021-03-15 ENCOUNTER — Encounter (HOSPITAL_COMMUNITY)
Admission: RE | Disposition: A | Discharge status: Home / Self Care | Payer: Self-pay | Source: Ambulatory Visit | Attending: General Surgery

## 2021-03-15 ENCOUNTER — Encounter (HOSPITAL_COMMUNITY): Payer: Self-pay | Admitting: General Surgery

## 2021-03-15 ENCOUNTER — Ambulatory Visit (HOSPITAL_COMMUNITY): Payer: Medicare HMO | Admitting: Certified Registered"

## 2021-03-15 ENCOUNTER — Other Ambulatory Visit: Payer: Self-pay

## 2021-03-15 ENCOUNTER — Ambulatory Visit (HOSPITAL_COMMUNITY)
Admission: RE | Admit: 2021-03-15 | Discharge: 2021-03-15 | Disposition: A | Discharge status: Home / Self Care | Payer: Medicare HMO | Source: Ambulatory Visit | Attending: General Surgery | Admitting: General Surgery

## 2021-03-15 DIAGNOSIS — F17210 Nicotine dependence, cigarettes, uncomplicated: Secondary | ICD-10-CM | POA: Insufficient documentation

## 2021-03-15 DIAGNOSIS — K4090 Unilateral inguinal hernia, without obstruction or gangrene, not specified as recurrent: Secondary | ICD-10-CM

## 2021-03-15 HISTORY — PX: INGUINAL HERNIA REPAIR: SHX194

## 2021-03-15 SURGERY — REPAIR, HERNIA, INGUINAL, ADULT
Anesthesia: General | Site: Inguinal | Laterality: Left

## 2021-03-15 MED ORDER — CHLORHEXIDINE GLUCONATE CLOTH 2 % EX PADS: 6.0000 | MEDICATED_PAD | Freq: Once | CUTANEOUS | Status: DC

## 2021-03-15 MED ORDER — BUPIVACAINE HCL (300 MG DOSE) 3 X 100 MG IL IMPL
DRUG_IMPLANT | Status: AC
  Filled 2021-03-15: qty 100

## 2021-03-15 MED ORDER — ONDANSETRON HCL 4 MG/2ML IJ SOLN
INTRAMUSCULAR | Status: AC
  Filled 2021-03-15: qty 2

## 2021-03-15 MED ORDER — MIDAZOLAM HCL 2 MG/2ML IJ SOLN
INTRAMUSCULAR | Status: AC
  Filled 2021-03-15: qty 2

## 2021-03-15 MED ORDER — ORAL CARE MOUTH RINSE: 15.0000 mL | Freq: Once | OROMUCOSAL | Status: AC

## 2021-03-15 MED ORDER — FENTANYL CITRATE PF 50 MCG/ML IJ SOSY: 25.0000 ug | PREFILLED_SYRINGE | INTRAMUSCULAR | Status: DC | PRN

## 2021-03-15 MED ORDER — SODIUM CHLORIDE 0.9 % IR SOLN
Status: DC | PRN
  Administered 2021-03-15: 1000 mL

## 2021-03-15 MED ORDER — PROPOFOL 10 MG/ML IV BOLUS
INTRAVENOUS | Status: AC
  Filled 2021-03-15: qty 20

## 2021-03-15 MED ORDER — LIDOCAINE HCL (PF) 2 % IJ SOLN
INTRAMUSCULAR | Status: AC
  Filled 2021-03-15: qty 5

## 2021-03-15 MED ORDER — LACTATED RINGERS IV SOLN: INTRAVENOUS | Status: DC

## 2021-03-15 MED ORDER — KETOROLAC TROMETHAMINE 30 MG/ML IJ SOLN
15.0000 mg | Freq: Once | INTRAMUSCULAR | Status: AC
  Administered 2021-03-15: 15 mg via INTRAVENOUS
  Filled 2021-03-15: qty 1

## 2021-03-15 MED ORDER — LIDOCAINE HCL (CARDIAC) PF 100 MG/5ML IV SOSY
PREFILLED_SYRINGE | INTRAVENOUS | Status: DC | PRN
  Administered 2021-03-15: 100 mg via INTRAVENOUS

## 2021-03-15 MED ORDER — CEFAZOLIN SODIUM-DEXTROSE 2-4 GM/100ML-% IV SOLN
2.0000 g | INTRAVENOUS | Status: AC
  Administered 2021-03-15: 2 g via INTRAVENOUS
  Filled 2021-03-15: qty 100

## 2021-03-15 MED ORDER — FENTANYL CITRATE (PF) 250 MCG/5ML IJ SOLN
INTRAMUSCULAR | Status: AC
  Filled 2021-03-15: qty 5

## 2021-03-15 MED ORDER — MIDAZOLAM HCL 5 MG/5ML IJ SOLN
INTRAMUSCULAR | Status: DC | PRN
  Administered 2021-03-15: 2 mg via INTRAVENOUS

## 2021-03-15 MED ORDER — PROPOFOL 10 MG/ML IV BOLUS
INTRAVENOUS | Status: DC | PRN
  Administered 2021-03-15: 200 mg via INTRAVENOUS

## 2021-03-15 MED ORDER — ONDANSETRON HCL 4 MG/2ML IJ SOLN
INTRAMUSCULAR | Status: DC | PRN
  Administered 2021-03-15: 4 mg via INTRAVENOUS

## 2021-03-15 MED ORDER — FENTANYL CITRATE (PF) 100 MCG/2ML IJ SOLN
INTRAMUSCULAR | Status: DC | PRN
  Administered 2021-03-15 (×2): 50 ug via INTRAVENOUS

## 2021-03-15 MED ORDER — ONDANSETRON HCL 4 MG/2ML IJ SOLN: 4.0000 mg | Freq: Once | INTRAMUSCULAR | Status: DC | PRN

## 2021-03-15 MED ORDER — CHLORHEXIDINE GLUCONATE 0.12 % MT SOLN
15.0000 mL | Freq: Once | OROMUCOSAL | Status: AC
  Administered 2021-03-15: 15 mL via OROMUCOSAL

## 2021-03-15 MED ORDER — BUPIVACAINE HCL (300 MG DOSE) 3 X 100 MG IL IMPL
DRUG_IMPLANT | Status: DC | PRN
  Administered 2021-03-15: 200 mg

## 2021-03-15 SURGICAL SUPPLY — 32 items
ADH SKN CLS APL DERMABOND .7 (GAUZE/BANDAGES/DRESSINGS) ×1
CLOTH BEACON ORANGE TIMEOUT ST (SAFETY) ×2 IMPLANT
COVER LIGHT HANDLE STERIS (MISCELLANEOUS) ×4 IMPLANT
DERMABOND ADVANCED (GAUZE/BANDAGES/DRESSINGS) ×1
DERMABOND ADVANCED .7 DNX12 (GAUZE/BANDAGES/DRESSINGS) ×1 IMPLANT
DRAIN PENROSE 0.5X18 (DRAIN) ×2 IMPLANT
ELECT REM PT RETURN 9FT ADLT (ELECTROSURGICAL) ×2
ELECTRODE REM PT RTRN 9FT ADLT (ELECTROSURGICAL) ×1 IMPLANT
GAUZE SPONGE 4X4 12PLY STRL (GAUZE/BANDAGES/DRESSINGS) ×2 IMPLANT
GLOVE SURG POLYISO LF SZ7.5 (GLOVE) ×2 IMPLANT
GLOVE SURG UNDER POLY LF SZ7 (GLOVE) ×6 IMPLANT
GOWN STRL REUS W/TWL LRG LVL3 (GOWN DISPOSABLE) ×6 IMPLANT
INST SET MINOR GENERAL (KITS) ×2 IMPLANT
KIT TURNOVER KIT A (KITS) ×2 IMPLANT
MANIFOLD NEPTUNE II (INSTRUMENTS) ×2 IMPLANT
MESH HERNIA 1.6X1.9 PLUG LRG (Mesh General) ×1 IMPLANT
MESH HERNIA PLUG LRG (Mesh General) ×1 IMPLANT
NS IRRIG 1000ML POUR BTL (IV SOLUTION) ×2 IMPLANT
PACK MINOR (CUSTOM PROCEDURE TRAY) ×2 IMPLANT
PAD ARMBOARD 7.5X6 YLW CONV (MISCELLANEOUS) ×2 IMPLANT
SET BASIN LINEN APH (SET/KITS/TRAYS/PACK) ×2 IMPLANT
SOL PREP PROV IODINE SCRUB 4OZ (MISCELLANEOUS) ×2 IMPLANT
SUT MNCRL AB 4-0 PS2 18 (SUTURE) ×2 IMPLANT
SUT NOVA NAB GS-22 2 2-0 T-19 (SUTURE) ×6 IMPLANT
SUT SILK 3 0 (SUTURE)
SUT SILK 3-0 18XBRD TIE 12 (SUTURE) IMPLANT
SUT VIC AB 2-0 CT1 27 (SUTURE) ×2
SUT VIC AB 2-0 CT1 TAPERPNT 27 (SUTURE) ×1 IMPLANT
SUT VIC AB 3-0 SH 27 (SUTURE) ×2
SUT VIC AB 3-0 SH 27X BRD (SUTURE) ×1 IMPLANT
SUT VICRYL AB 2 0 TIES (SUTURE) ×2 IMPLANT
SUT VICRYL AB 3 0 TIES (SUTURE) IMPLANT

## 2021-03-15 NOTE — Transfer of Care (Signed)
Immediate Anesthesia Transfer of Care Note  Patient: Mathew Grant  Procedure(s) Performed: HERNIA REPAIR INGUINAL ADULT WITH MESH (Left: Inguinal)  Patient Location: PACU  Anesthesia Type:General  Level of Consciousness: drowsy, patient cooperative and responds to stimulation  Airway & Oxygen Therapy: Patient Spontanous Breathing  Post-op Assessment: Report given to RN, Post -op Vital signs reviewed and stable and Patient moving all extremities X 4  Post vital signs: Reviewed and stable  Last Vitals:  Vitals Value Taken Time  BP    Temp    Pulse    Resp    SpO2      Last Pain:  Vitals:   03/15/21 0944  TempSrc: Oral  PainSc: 3          Complications: No notable events documented.

## 2021-03-15 NOTE — Anesthesia Preprocedure Evaluation (Signed)
Anesthesia Evaluation  Patient identified by MRN, date of birth, ID band Patient awake    Reviewed: Allergy & Precautions, H&P , NPO status , Patient's Chart, lab work & pertinent test results, reviewed documented beta blocker date and time   Airway Mallampati: II  TM Distance: >3 FB Neck ROM: full    Dental no notable dental hx. (+) Teeth Intact   Pulmonary neg pulmonary ROS, Current Smoker,    Pulmonary exam normal breath sounds clear to auscultation       Cardiovascular Exercise Tolerance: Good hypertension, negative cardio ROS   Rhythm:regular Rate:Normal     Neuro/Psych  Neuromuscular disease CVA, Residual Symptoms negative psych ROS   GI/Hepatic hiatal hernia, PUD, GERD  Medicated,(+) Hepatitis -, C  Endo/Other  negative endocrine ROS  Renal/GU negative Renal ROS  negative genitourinary   Musculoskeletal   Abdominal   Peds  Hematology negative hematology ROS (+)   Anesthesia Other Findings   Reproductive/Obstetrics negative OB ROS                             Anesthesia Physical Anesthesia Plan  ASA: 3  Anesthesia Plan: General and General LMA   Post-op Pain Management:    Induction:   PONV Risk Score and Plan: Ondansetron  Airway Management Planned:   Additional Equipment:   Intra-op Plan:   Post-operative Plan:   Informed Consent: I have reviewed the patients History and Physical, chart, labs and discussed the procedure including the risks, benefits and alternatives for the proposed anesthesia with the patient or authorized representative who has indicated his/her understanding and acceptance.     Dental Advisory Given  Plan Discussed with: CRNA  Anesthesia Plan Comments:         Anesthesia Quick Evaluation

## 2021-03-15 NOTE — Interval H&P Note (Signed)
History and Physical Interval Note:  03/15/2021 10:52 AM  Mathew Grant  has presented today for surgery, with the diagnosis of Inguinal hernia.  The various methods of treatment have been discussed with the patient and family. After consideration of risks, benefits and other options for treatment, the patient has consented to  Procedure(s): HERNIA REPAIR INGUINAL ADULT WITH MESH (Left) as a surgical intervention.  The patient's history has been reviewed, patient examined, no change in status, stable for surgery.  I have reviewed the patient's chart and labs.  Questions were answered to the patient's satisfaction.     Aviva Signs

## 2021-03-15 NOTE — Op Note (Signed)
Patient:  Mathew Grant  DOB:  1951/05/14  MRN:  [id number]   Preop Diagnosis: Left inguinal hernia  Postop Diagnosis: Same  Procedure: Left inguinal herniorrhaphy with mesh  Surgeon: Aviva Signs, MD  Anes: General  Indications: Patient is a 70 year old white male who presents with a symptomatic left inguinal hernia.  The risks and benefits of the procedure including bleeding, infection, mesh use, possibly recurrence of the hernia were fully explained to the patient, who gave informed consent.  Procedure note: The patient was placed in the supine position.  After general anesthesia was administered, the left groin region was prepped and draped using the usual sterile technique with Betadine.  Surgical site confirmation was performed.  An incision was made in the left groin region down to the external oblique aponeurosis.  The aponeurosis was incised to the external ring.  A Penrose drain was placed around the spermatic cord.  The vas deferens was noted within the spermatic cord.  The ilioinguinal nerve was identified and retracted inferiorly from the operative field.  The patient had a lipoma of the cord and a high ligation was performed using a 3-0 Vicryl tied.  An indirect hernia was found.  This was freed away from the spermatic cord up to the peritoneal reflection and inverted.  A large Bard PerFix plug was then placed.  An onlay patch was placed along the floor of the inguinal canal and secured superiorly to the conjoined tendon and inferiorly to the shelving edge of Poupart's ligament using 2-0 Novafil interrupted sutures.  The internal ring was recreated using a 2-0 Novafil interrupted suture.  [name] was placed over the mesh and in the subcutaneous tissue.  The external oblique aponeurosis was reapproximated using a 2-0 Vicryl running suture.  The subcutaneous layer was reapproximated using 3-0 Vicryl interrupted suture.  The skin was closed using a 4-0 Monocryl subcuticular suture.   Dermabond was applied.  All tape and needle counts were correct at the end the procedure.  Patient was awakened and transferred to PACU in stable condition.  Complications: None  EBL: Minimal  Specimen: None

## 2021-03-15 NOTE — Anesthesia Postprocedure Evaluation (Signed)
Anesthesia Post Note  Patient: Mathew Grant  Procedure(s) Performed: HERNIA REPAIR INGUINAL ADULT WITH MESH (Left: Inguinal)  Patient location during evaluation: Phase II Anesthesia Type: General Level of consciousness: awake Pain management: pain level controlled Vital Signs Assessment: post-procedure vital signs reviewed and stable Respiratory status: spontaneous breathing and respiratory function stable Cardiovascular status: blood pressure returned to baseline and stable Postop Assessment: no headache and no apparent nausea or vomiting Anesthetic complications: no Comments: Late entry   No notable events documented.   Last Vitals:  Vitals:   03/15/21 0944  BP: (!) 161/79  Pulse: 74  Resp: 16  Temp: 37.1 C  SpO2: 100%    Last Pain:  Vitals:   03/15/21 0944  TempSrc: Oral  PainSc: [place]

## 2021-03-15 NOTE — Anesthesia Procedure Notes (Signed)
Procedure Name: LMA Insertion Date/Time: 03/15/2021 11:19 AM Performed by: Tacy Learn, CRNA Pre-anesthesia Checklist: Patient identified, Emergency Drugs available, Suction available, Patient being monitored and Timeout performed Patient Re-evaluated:Patient Re-evaluated prior to induction Oxygen Delivery Method: Circle system utilized Preoxygenation: Pre-oxygenation with 100% oxygen Induction Type: IV induction LMA: LMA inserted LMA Size: 4.0 Number of attempts: 1 Placement Confirmation: positive ETCO2, CO2 detector and breath sounds checked- equal and bilateral Tube secured with: Tape Dental Injury: Teeth and Oropharynx as per pre-operative assessment

## 2021-03-16 ENCOUNTER — Encounter (HOSPITAL_COMMUNITY): Payer: Self-pay | Admitting: General Surgery

## 2021-03-24 ENCOUNTER — Telehealth (INDEPENDENT_AMBULATORY_CARE_PROVIDER_SITE_OTHER): Payer: Medicare HMO | Admitting: General Surgery

## 2021-03-24 DIAGNOSIS — Z09 Encounter for follow-up examination after completed treatment for conditions other than malignant neoplasm: Secondary | ICD-10-CM

## 2021-03-24 NOTE — Telephone Encounter (Signed)
Postoperative virtual telephone visit performed with patient.  Patient states that he is mildly sore, but it is resolving.  He also has had some bruising which is resolving.  He is starting to resume his normal activity.  He has no other complaints.  I told the patient that he may increase his normal activity as able.  I told him to call me should he have any problems.  As this was a part of the global surgical fee, this was not a billable visit.  Total telephone time was 2 minutes.

## 2023-01-11 ENCOUNTER — Inpatient Hospital Stay: Admit: 2023-01-11 | Payer: Medicare HMO | Admitting: Internal Medicine

## 2023-01-11 ENCOUNTER — Encounter (HOSPITAL_COMMUNITY): Payer: Self-pay
